# Patient Record
Sex: Male | Born: 1995 | Race: White | Hispanic: No | Marital: Married | Smoking: Never smoker
Health system: Southern US, Community
[De-identification: ages and names within clinical notes are randomized; demographics above are authoritative.]

## PROBLEM LIST (undated history)

## (undated) DIAGNOSIS — K219 Gastro-esophageal reflux disease without esophagitis: Secondary | ICD-10-CM

## (undated) DIAGNOSIS — F909 Attention-deficit hyperactivity disorder, unspecified type: Secondary | ICD-10-CM

## (undated) DIAGNOSIS — F419 Anxiety disorder, unspecified: Secondary | ICD-10-CM

## (undated) DIAGNOSIS — J301 Allergic rhinitis due to pollen: Secondary | ICD-10-CM

## (undated) HISTORY — DX: Gastro-esophageal reflux disease without esophagitis: K21.9

## (undated) HISTORY — DX: Attention-deficit hyperactivity disorder, unspecified type: F90.9

## (undated) HISTORY — DX: Anxiety disorder, unspecified: F41.9

## (undated) HISTORY — DX: Allergic rhinitis due to pollen: J30.1

---
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
## 2018-03-17 ENCOUNTER — Telehealth: Payer: Self-pay | Admitting: Physician Assistant

## 2018-03-17 NOTE — Telephone Encounter (Signed)
Patient has appointment with me on Friday, Nov 8.  Please inform patient that I do not prescribe ADD and ADHD medications. I am happy to see patient as long as he understands that I do not prescribe these medications.  Thanks!  Lelon Mast

## 2018-03-18 NOTE — Telephone Encounter (Signed)
Left message to return call to our office.  

## 2018-03-19 ENCOUNTER — Encounter: Payer: Self-pay | Admitting: Physician Assistant

## 2018-03-19 ENCOUNTER — Ambulatory Visit (INDEPENDENT_AMBULATORY_CARE_PROVIDER_SITE_OTHER): Payer: 59 | Admitting: Physician Assistant

## 2018-03-19 VITALS — BP 130/84 | HR 92 | Temp 98.3°F | Ht 66.5 in | Wt 208.5 lb

## 2018-03-19 DIAGNOSIS — F321 Major depressive disorder, single episode, moderate: Secondary | ICD-10-CM | POA: Diagnosis not present

## 2018-03-19 DIAGNOSIS — Z23 Encounter for immunization: Secondary | ICD-10-CM

## 2018-03-19 DIAGNOSIS — F419 Anxiety disorder, unspecified: Secondary | ICD-10-CM | POA: Diagnosis not present

## 2018-03-19 DIAGNOSIS — F909 Attention-deficit hyperactivity disorder, unspecified type: Secondary | ICD-10-CM | POA: Diagnosis not present

## 2018-03-19 NOTE — Patient Instructions (Addendum)
We will are going to try to get you in to see Dr. Rene Kocher, a psychiatrist, as soon as possible to help you get back on track.  If you have worsening depression or thoughts, please go to the ER.    [facility] and Scheduling: [phone]

## 2018-03-19 NOTE — Progress Notes (Signed)
Dylan Vasquez is a 22 y.o. male here to Establish Care.  I acted as a Neurosurgeon for [employer], PA-C Corky Mull, LPN  History of Present Illness:   Chief Complaint  Patient presents with  . Establish Care  . Anxiety  . referrall for ADHD    Acute Concerns: Anxiety and Depression, ADHD - hx of severe. States that it seems to be returning. He is currently in his first semester of law school. Was seeing a psychiatrist at [facility] while there. States that he was dx with ADHD at age 89. Was retested in late teens and confirmed ongoing ADHD. Also endorses dysgraphia and dyslexia. Hx of suicide attempt in 2015 -- was sitting in a car in his garage while the car was running. Saw a therapist at one point but didn't find it to be helpful. While at [facility] he was seeing a psychiatrist. He states that he is uncertain of all of the medications that he has been on in the past but was on both prozac and vyvanse at one point. States that "I was on a 'really high dose' of one of them." In 2017 he stopped his vyvanse as he was doing better in school and thought he didn't need it. He is now in his first semester at [employer] and his grades are suffering, he thinks that he needs to resume his stimulant. He endorses difficulty focusing. Denies any current SI/HI. Has relatively good support group. Hangs out with other law students and has several hobbies.   Denies excessive ETOH intake, approx 3-6 beers/week.  Health Maintenance: Immunizations -- UTD, will give Flu shot today Diet and exercise -- fair, has limited time to exercise Caffeine intake -- none, doesn't help with staying awake and causes GI distress Sleep habits -- 7-8 hours of sleep is good Weight -- Weight: 208 lb 8 oz (94.6 kg)   Depression screen PHQ 2/9 03/19/2018  Decreased Interest 1  Down, Depressed, Hopeless 1  PHQ - 2 Score 2  Altered sleeping 2  Tired, decreased energy 3  Change in appetite 3  Feeling bad or failure about yourself  3   Trouble concentrating 2  Moving slowly or fidgety/restless 3  Suicidal thoughts 1  PHQ-9 Score 19  Difficult doing work/chores Somewhat difficult    GAD 7 : Generalized Anxiety Score 03/19/2018  Nervous, Anxious, on Edge 2  Control/stop worrying 2  Worry too much - different things 2  Trouble relaxing 2  Restless 1  Easily annoyed or irritable 2  Afraid - awful might happen 3  Total GAD 7 Score 14  Anxiety Difficulty Very difficult     Past Medical History:  Diagnosis Date  . ADHD (attention deficit hyperactivity disorder)   . Anxiety   . GERD (gastroesophageal reflux disease)      Social History   Socioeconomic History  . Marital status: Single    Spouse name: Not on file  . Number of children: Not on file  . Years of education: Not on file  . Highest education level: Not on file  Occupational History  . Not on file  Social Needs  . Financial resource strain: Not on file  . Food insecurity:    Worry: Not on file    Inability: Not on file  . Transportation needs:    Medical: Not on file    Non-medical: Not on file  Tobacco Use  . Smoking status: Never Smoker  . Smokeless tobacco: Never Used  Substance and  Sexual Activity  . Alcohol use: Yes    Comment: 3-6 beers a week or liquor  . Drug use: Never  . Sexual activity: Yes  Lifestyle  . Physical activity:    Days per week: Not on file    Minutes per session: Not on file  . Stress: Not on file  Relationships  . Social connections:    Talks on phone: Not on file    Gets together: Not on file    Attends religious service: Not on file    Active member of club or organization: Not on file    Attends meetings of clubs or organizations: Not on file    Relationship status: Not on file  . Intimate partner violence:    Fear of current or ex partner: Not on file    Emotionally abused: Not on file    Physically abused: Not on file    Forced sexual activity: Not on file  Other Topics Concern  . Not on file   Social History Narrative  . Not on file    Past Surgical History:  Procedure Laterality Date  . WISDOM TOOTH EXTRACTION Bilateral 2013    History reviewed. No pertinent family history.  No Known Allergies   Current Medications:   Current Outpatient Medications:  .  cetirizine (ZYRTEC) 10 MG tablet, Take 10 mg by mouth as needed for allergies., Disp: , Rfl:  .  ibuprofen (ADVIL) 200 MG tablet, Take 200-400 mg by mouth as needed., Disp: , Rfl:    Review of Systems:   ROS  Negative unless otherwise specified per HPI.  Vitals:   Vitals:   03/19/18 1113  BP: 130/84  Pulse: 92  Temp: 98.3 F (36.8 C)  TempSrc: Oral  SpO2: 98%  Weight: 208 lb 8 oz (94.6 kg)  Height: 5' 6.5" (1.689 m)     Body mass index is 33.15 kg/m.  Physical Exam:   Physical Exam  Constitutional: He appears well-developed. He is cooperative.  Non-toxic appearance. He does not have a sickly appearance. He does not appear ill. No distress.  Cardiovascular: Normal rate, regular rhythm, S1 normal, S2 normal, normal heart sounds and normal pulses.  No LE edema  Pulmonary/Chest: Effort normal and breath sounds normal.  Neurological: He is alert. GCS eye subscore is 4. GCS verbal subscore is 5. GCS motor subscore is 6.  Skin: Skin is warm, dry and intact.  Psychiatric: His speech is normal and behavior is normal. His mood appears anxious.  Nursing note and vitals reviewed.   Assessment and Plan:    Timohty was seen today for establish care, anxiety and referrall for adhd.  Diagnoses and all orders for this visit:  Anxiety; Depression, major, single episode, moderate (HCC); Attention deficit hyperactivity disorder (ADHD), unspecified ADHD type Uncontrolled. He is agreeable to seeing psychiatry. Will refer to Dr. Angus Palms. I discussed with patient that if they develop any SI, to tell someone immediately and seek medical attention. Will encourage talk therapy as able. We considered checking  baseline labs today however patient declined 2/2 needle phobia, although he was amenable to flu vaccine. -     Ambulatory referral to Psychiatry  Need for prophylactic vaccination and inoculation against influenza -     Flu Vaccine QUAD 36+ mos IM  . Reviewed expectations re: course of current medical issues. . Discussed self-management of symptoms. . Outlined signs and symptoms indicating need for more acute intervention. . Patient verbalized understanding and all questions were answered. Marland Kitchen  See orders for this visit as documented in the electronic medical record. . Patient received an After-Visit Summary.  CMA or LPN served as scribe during this visit. History, Physical, and Plan performed by medical provider. The above documentation has been reviewed and is accurate and complete.  Jarold Motto, PA-C

## 2018-03-22 NOTE — Telephone Encounter (Signed)
Pt seen and referred.

## 2018-04-07 ENCOUNTER — Ambulatory Visit (INDEPENDENT_AMBULATORY_CARE_PROVIDER_SITE_OTHER): Payer: 59 | Admitting: Mental Health

## 2018-04-07 DIAGNOSIS — F3341 Major depressive disorder, recurrent, in partial remission: Secondary | ICD-10-CM | POA: Diagnosis not present

## 2018-04-07 DIAGNOSIS — F410 Panic disorder [episodic paroxysmal anxiety] without agoraphobia: Secondary | ICD-10-CM | POA: Diagnosis not present

## 2018-04-07 DIAGNOSIS — F411 Generalized anxiety disorder: Secondary | ICD-10-CM | POA: Diagnosis not present

## 2018-04-07 NOTE — Progress Notes (Signed)
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 D/x by Dr. Epimenio FootKathy Ferrell-Swann at age [age]. 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  He stated he copes w/ a baseline anxiety, reports 4/10 in severity. Anxiety increases when confronted aggressively (someone yelling at him), when around multiple couples he knows and he is the "odd man out". Pt can be outgoing w/ friends, tries to use humor.   He stated he attempted suicide at age 22 due "my therapist saying some uncomfortable words. She told me I couldn't make the changes I wanted b/c I needed to accept myself first. He attempted suicide by running a car in the garage when the door shut to die due to exhaust chemicals from the car. His mother found and stopped him. He did not go to the hospital, mother and father talked w/ him a lot at that time. Since then, he stated I "stamp it down" referring to feelings of self harm returns, SI. He denied self injurious behaviors. He reported he copes w/ those passive SI moments about 1-3x/year.    Mental Status Exam:   Appearance:   Casual     Behavior:  Appropriate  Motor:  Normal  Speech/Language:   Clear and Coherent  Affect:  Appropriate  Mood:  normal  Thought process:  normal  Thought content:    WNL  Sensory/Perceptual disturbances:    WNL  Orientation:  oriented to person, place, time/date and situation  Attention:  Good  Concentration:  Good  Memory:  WNL  Fund of knowledge:   Good  Insight:    Good  Judgment:   Good  Impulse Control:  Good   Reported Symptoms:  Anxiety most  days, fatigue (possibly due to school demands), panic attacks (once every 2 months)  Risk Assessment: Danger to Self:  No Self-injurious Behavior: No Danger to Others: No Duty to Warn:no Physical Aggression / Violence:No  Access to Firearms a concern: No  Gang Involvement:No  Patient / guardian was educated about steps to take if suicide or homicide risk level increases between visits: yes While future psychiatric events cannot be accurately predicted, the patient does not currently require acute inpatient psychiatric care and does not currently meet [facility] involuntary commitment criteria.  Substance Abuse History: Current substance abuse: No     Past Psychiatric History:   Previous psychological history is significant for ADHD, anxiety and learning disability Outpatient Providers[place] up [place] 22 about 6 months. History of Psych Hospitalization: none Psychological Testing: age 22 - through the school   Medical History/Surgical History: Past Medical History:  Diagnosis Date  . ADHD (attention deficit hyperactivity disorder)   . Anxiety   . GERD (gastroesophageal reflux disease)     Past Surgical History:  Procedure Laterality Date  . WISDOM TOOTH EXTRACTION Bilateral 2013    Medications: Current Outpatient Medications  Medication Sig Dispense Refill  . cetirizine (ZYRTEC) 10 MG tablet Take 10 mg by mouth as needed for allergies.    Marland Kitchen. ibuprofen (ADVIL) 200 MG tablet Take 200-400 mg by mouth as needed.  No current facility-administered medications for this visit.     No Known Allergies  Abuse History: Victim: none Report needed: no Perpetrator of abuse: no Witness / Exposure to Domestic Violence:  none Protective Services Involvement: no Witness to [employer] Violence:  no   Family / Social History:    Living situation: Lives alone Sexual Orientation: heterosexual Relationship Status:   single Name of spouse /  other: none If a parent, number of children / ages:   none  Support Systems:  Family, friends  Surveyor, quantity Stress:   none  Income/Employment/Disability: full time Publishing rights manager: no  Educational History:   B.A in Anthropology, currently law school at [employer]:      Any cultural differences that may affect / interfere with treatment:    Recreation/Hobbies: board games, time w/ friends  Stressors:  School, social, chronicity of sx's  Strengths:  Engaging, compassionate,   Barriers: none  Legal History: none  Pending legal issue / charges: none  History of legal issue / charges: none   Diagnoses:    1.  GAD (generalized anxiety disorder) F41.1      2.  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  Major depressive disorder, recurrent episode, in partial remission (HCC)        1.  Patient to continue to engage in individual counseling 2-4 times a month or as needed. 2.  Patient to identify and apply CBT, coping skills learned in session to decrease depression and anxiety symptoms. 3.  Patient to contact this office, go to the local ED or call 911 if a crisis or emergency develops between visits.   Waldron Session, [facility]

## 2018-05-17 ENCOUNTER — Ambulatory Visit: Payer: 59 | Admitting: Mental Health

## 2018-06-07 ENCOUNTER — Ambulatory Visit (INDEPENDENT_AMBULATORY_CARE_PROVIDER_SITE_OTHER): Payer: 59 | Admitting: Mental Health

## 2018-06-07 DIAGNOSIS — F411 Generalized anxiety disorder: Secondary | ICD-10-CM | POA: Diagnosis not present

## 2018-06-07 NOTE — Progress Notes (Signed)
Psychotherapy Note  Name: Dylan Vasquez Date: 06/07/2018 MRN: [id number] DOB: 05-Jul-1995 PCP: Jarold Motto, PA  Time spent: 58 mintues  Treatment:   Individual Therapy  Mental Status Exam:   Appearance:   Casual     Behavior:  Appropriate  Motor:  Normal  Speech/Language:   Clear and Coherent  Affect:  Appropriate  Mood:  normal  Thought process:  normal  Thought content:    WNL  Sensory/Perceptual disturbances:    WNL  Orientation:  oriented to person, place, time/date and situation  Attention:  Good  Concentration:  Good  Memory:  WNL  Fund of knowledge:   Good  Insight:    Good  Judgment:   Good  Impulse Control:  Good   Reported Symptoms:  Anxiety most days, fatigue (possibly due to school demands), panic attacks (once every 2 months)  Risk Assessment: Danger to Self:  No Self-injurious Behavior: No Danger to Others: No Duty to Warn:no Physical Aggression / Violence:No  Access to Firearms a concern: No  Gang Involvement:No  Patient / guardian was educated about steps to take if suicide or homicide risk level increases between visits: yes While future psychiatric events cannot be accurately predicted, the patient does not currently require acute inpatient psychiatric care and does not currently meet [facility] involuntary commitment criteria.  Subjective:  Stated he has had some stress w/ school, specifically with one teacher, but he has been resourceful. Some rumination at night, worries about class if he does not stay busy. He tries to go for walks a night but tries to be [place].  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 He got anxious, he showed up early for the pictures, has a tendency to worry- does not want to make others unhappy. He did not want to get "sniped at", "my grM has a tendency to do this when you mess up". Pt is closer to his  mother, father worked a lot but they verbalized loving each other are supportive. He plans to integrate diaphragmatic breathing exercises discussed today as a coping tool between sessions.     Interventions:  CBT, supportive therapy     1.  Patient to continue to engage in individual counseling 2-4 times a month or as needed. 2.  Patient to identify and apply CBT, coping skills learned in session to decrease depression and anxiety symptoms. 3.  Patient to contact this office, go to the local ED or call 911 if a crisis or emergency develops between visits.   Diagnoses:    1.  GAD (generalized anxiety disorder) F41.1      2.  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  Major depressive disorder, recurrent episode, in partial remission (HCC)       Plan: 1.  Patient to continue to engage in individual counseling 2-4 times a month or as needed. 2.  Patient to identify and apply CBT, coping skills learned in session to decrease depression and anxiety symptoms. 3.  Patient to contact this office, go to the local ED or call 911 if a crisis or emergency develops between visits.   Waldron Session, [facility]

## 2018-07-01 ENCOUNTER — Ambulatory Visit: Payer: 59 | Admitting: Mental Health

## 2018-07-21 ENCOUNTER — Other Ambulatory Visit: Payer: Self-pay

## 2018-07-21 ENCOUNTER — Ambulatory Visit (INDEPENDENT_AMBULATORY_CARE_PROVIDER_SITE_OTHER): Payer: 59 | Admitting: Mental Health

## 2018-07-21 DIAGNOSIS — F411 Generalized anxiety disorder: Secondary | ICD-10-CM

## 2018-07-21 NOTE — Progress Notes (Signed)
Psychotherapy Note  Name: Dylan Vasquez Date: 07/21/2018 MRN: [id number] DOB: 1996/03/21 PCP: Jarold Motto, PA  Time spent: 91 mintues  Treatment:   Individual Therapy  Mental Status Exam:   Appearance:   Casual     Behavior:  Appropriate  Motor:  Normal  Speech/Language:   Clear and Coherent  Affect:  Appropriate  Mood:  Congruent, anxious  Thought process:  normal  Thought content:    WNL  Sensory/Perceptual disturbances:    WNL  Orientation:  oriented to person, place, time/date and situation  Attention:  Good  Concentration:  Good  Memory:  WNL  Fund of knowledge:   Good  Insight:    Good  Judgment:   Good  Impulse Control:  Good   Reported Symptoms:  Anxiety most days, fatigue (possibly due to school demands), panic attacks (once every 2 months)  Risk Assessment: Danger to Self:  No Self-injurious Behavior: No Danger to Others: No Duty to Warn:no Physical Aggression / Violence:No  Access to Firearms a concern: No  Gang Involvement:No  Patient / guardian was educated about steps to take if suicide or homicide risk level increases between visits: yes While future psychiatric events cannot be accurately predicted, the patient does not currently require acute inpatient psychiatric care and does not currently meet [facility] involuntary commitment criteria.  Subjective:  He shared progress at school. He has finals next week, some anxiety related but is managing well. He is prepared.  He shared social experiences, how he is making some friends at school and this is a positive change in his life.  He continues to reach out to his teacher as needed for clarification and assistance, working on being assertive in this area.  Due to the recent virus pandemic, patient stated that his school is suspending face-to-face instruction and they are to pursue on line instruction for the remainder of the semester.  Patient stated he plans to return home to his parents during this  time.  Ways to cope and care for himself were explored.  Patient was encouraged to contact this office between sessions if needed.  Interventions:  CBT, supportive therapy   Diagnoses:    1.  GAD (generalized anxiety disorder) F41.1      2.  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  Major depressive disorder, recurrent episode, in partial remission (HCC)       Plan: 1.  Patient to continue to engage in individual counseling 2-4 times a month or as needed. 2.  Patient to identify and apply CBT, coping skills learned in session to decrease depression and anxiety symptoms. 3.  Patient to contact this office, go to the local ED or call 911 if a crisis or emergency develops between visits.   Waldron Session, [facility]

## 2018-08-18 ENCOUNTER — Ambulatory Visit (INDEPENDENT_AMBULATORY_CARE_PROVIDER_SITE_OTHER): Payer: 59 | Admitting: Mental Health

## 2018-08-18 ENCOUNTER — Other Ambulatory Visit: Payer: Self-pay

## 2018-08-18 DIAGNOSIS — F411 Generalized anxiety disorder: Secondary | ICD-10-CM | POA: Diagnosis not present

## 2018-08-18 NOTE — Progress Notes (Signed)
Psychotherapy Note  Name: Dylan ShutterWilliam Schoening Date: 08/18/2018 MRN: [id number] DOB: June 26, 1995 PCP: Jarold MottoWorley, Samantha, PA  Time spent: 45 mintues  Treatment:   Individual Therapy  Mental Status Exam:   Appearance:   UTA- unable to assess    Behavior:  UTA  Motor:  UTA  Speech/Language:   Clear and Coherent  Affect:  UTA  Mood:  Congruent, anxious  Thought process:  normal  Thought content:    WNL  Sensory/Perceptual disturbances:    WNL  Orientation:  oriented to person, place, time/date and situation  Attention:  Good  Concentration:  Good  Memory:  WNL  Fund of knowledge:   Good  Insight:    Good  Judgment:   Good  Impulse Control:  Good   Reported Symptoms:  Anxiety most days, fatigue (possibly due to school demands), panic attacks (once every 2 months)  Risk Assessment: Danger to Self:  No Self-injurious Behavior: No Danger to Others: No Duty to Warn:no Physical Aggression / Violence:No  Access to Firearms a concern: No  Gang Involvement:No  Patient / guardian was educated about steps to take if suicide or homicide risk level increases between visits: yes While future psychiatric events cannot be accurately predicted, the patient does not currently require acute inpatient psychiatric care and does not currently meet [facility]  involuntary commitment criteria.  Subjective:   Pt engaged in teletherapy session. He shared progress at school, continues online classes.  Finished last finals well academically. Trying to keep up w/ school, listening to audio books, talking w/ friends. Trying to avoid too much downtime. He shared social experiences, how he is making some friends at school and this is a positive change in his life.  He continues to reach out to his teacher as needed for clarification and assistance, working on being assertive in this area.  Due to the recent virus pandemic, he is sharing how he is coping. Some worry about his grM due to her age and her not being  "cautious enough".  Relationships w/ parents, sister with whom he is living was assess, stated they are doing well, all staying busy with school work and parents w/ work.  Encouraged keeping a daily schedule, he is making efforts in this area. Plans to continue mindfulness to evaluating self talk to manage anxiety and depression sx's.  Patient was encouraged to contact this office between sessions if needed.   Virtual Visit via Telephone Note I connected with patient by a video enabled telemedicine application or telephone, with their informed consent, and verified patient privacy and that I am speaking with the correct person using two identifiers.    I discussed the limitations, risks, security and privacy concerns of performing psychotherapy and management service by telephone and the availability of in person appointments. I also discussed with the patient that there may be a patient responsible charge related to this service. The patient expressed understanding and agreed to proceed.  I discussed the treatment planning with the patient. The patient was provided an opportunity to ask questions and all were answered. The patient agreed with the plan and demonstrated an understanding of the instructions.   The patient was advised to call  our office if  symptoms worsen or feel they are in a crisis state and need immediate contact.  Interventions:  CBT, supportive therapy  Diagnoses:    1.  GAD (generalized anxiety disorder) F41.1      2.  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  Major depressive disorder, recurrent episode, in partial remission (HCC)      Plan: 1.  Patient to continue to engage in individual counseling 2-4 times a month or as needed. 2.  Patient to identify and apply CBT, coping skills learned in session to decrease depression and anxiety symptoms. 3.  Patient to contact this office, go to the local ED or call 911 if a crisis or emergency develops between  visits.   Waldron Session, [facility]

## 2019-01-06 ENCOUNTER — Other Ambulatory Visit: Payer: Self-pay

## 2019-01-06 ENCOUNTER — Encounter: Payer: Self-pay | Admitting: Psychiatry

## 2019-01-06 ENCOUNTER — Ambulatory Visit (INDEPENDENT_AMBULATORY_CARE_PROVIDER_SITE_OTHER): Payer: BC Managed Care – PPO | Admitting: Psychiatry

## 2019-01-06 VITALS — BP 137/76 | HR 68 | Ht 67.0 in | Wt 200.0 lb

## 2019-01-06 DIAGNOSIS — F411 Generalized anxiety disorder: Secondary | ICD-10-CM | POA: Diagnosis not present

## 2019-01-06 DIAGNOSIS — F810 Specific reading disorder: Secondary | ICD-10-CM | POA: Diagnosis not present

## 2019-01-06 DIAGNOSIS — F82 Specific developmental disorder of motor function: Secondary | ICD-10-CM | POA: Insufficient documentation

## 2019-01-06 DIAGNOSIS — F900 Attention-deficit hyperactivity disorder, predominantly inattentive type: Secondary | ICD-10-CM

## 2019-01-06 DIAGNOSIS — F8181 Disorder of written expression: Secondary | ICD-10-CM | POA: Diagnosis not present

## 2019-01-06 MED ORDER — LISDEXAMFETAMINE DIMESYLATE 30 MG PO CAPS: 30.0000 mg | ORAL_CAPSULE | Freq: Every day | ORAL | 0 refills | Status: DC

## 2019-01-06 NOTE — Progress Notes (Signed)
[facility] MD/PA/NP Initial Note  01/06/2019 12:57 PM [name]  MRN:  [id number] PCP: Inda Coke, PA Time spent: 45 minutes from 0800 to 0845  Chief Complaint:  Chief Complaint    Anxiety; ADHD; Depression; Panic Attack      HPI: Dylan Vasquez is seen onsite in office face-to-face individually with therapy and epic collateral for psychiatric interview and exam in evaluation and management of therapy differential of generalized and panic anxiety with major depression at least by history while patient's chief concern is ADHD.  Patient reports having awkward anxiety through the past not fully social or panic estimating 1 or 2 panic attacks a year.  However he has generalized worry with doubts consequential daily. He did have a suicide attempt apparently in high school running the car in the garage for 3 minutes before intervened by family, though he did not receive hospitalization then and was more desperate for help than wanting to die.  He also fell back in a chair in oedipal years requiring stapling of a scalp laceration having questionable loss of consciousness briefly.  Testing apparently in middle school concluded ADHD-like father who may also have learning disabilities, while patient was tested at least in high school by Reinaldo Berber, PhD in [place] finding learning variances for which he currently also receives accommodations at [employer] starting his second year then to have 1-1/2 years to graduate.  He may have had therapy first of all or foremost in [place] that was not a successful experience.  He went off of Vyvanse for a while in college realizing the need subsequently and restarting particularly in law school.  Vyvanse 70 mg had caused weight loss of 10 pounds while 30 mg has been his best dose for the balance of anxiety and ADHD.  He has most recently been under the psychiatric care of Patriciaann Clan, [place] at [facility] with last fill of Vyvanse per [place] registry  being 10/25/2018.  He had Prozac in high school still having some capsules remaining in his possessions but declining to take further OR similar agent at this time finding Prozac was hard on his GERD and stomach system in general.  He has difficulty recalling the name for certain but Inda Coke, PA has documented that he had Prozac at that time as his only antianxiety or antidepressant.  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  Family now is moving from [place] where they moved to join him in college at [employer] degree in anthropology achieved in 4 years with father now getting a better job in [place] with insurance better and patient comfortable to continue his Vyvanse and his accommodations for his learning variances at [employer].  He is not having depression, panic attacks, suicidality, mania, psychosis, or substance use.  Visit Diagnosis:    ICD-10-CM   1. Attention deficit hyperactivity disorder (ADHD), inattentive type, moderate  F90.0 lisdexamfetamine (VYVANSE) 30 MG capsule  2. Generalized anxiety disorder  F41.1   3. 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 Developmental coordination disorder  F82     Past Psychiatric History: ADHD treatment primarily Vyvanse 70 mg causing weight loss of 10 pounds rapidly but 30 mg being best tolerated and post efficacious taken since seventh grade but stopped in college until realizing her law school needed to have regular dosing.  Testing by Epimenio FootKathy Ferrell-Swann, PhD in  [place] in early high school apparently after suicide attempt with carbon monoxide identified learning disorders also, now still having accommodations and [employer] law school.  Therapy was unsuccessful in late high schoo and Prozac for anxiety in high school 20 mg was not tolerated well because of GERD and other GI side effects.  He now takes only Vyvanse had 4 sessions of therapy and winter and spring 2020 with [facility] now transferring his Vyvanse 30 mg daily for law school [facility] [place] for combined management in this office.  Past Medical History:  Past Medical History:  Diagnosis Date  . ADHD (attention deficit hyperactivity disorder)   . Allergic rhinitis due to pollen   . Anxiety   . GERD (gastroesophageal reflux disease)     Past Surgical History:  Procedure Laterality Date  . WISDOM TOOTH EXTRACTION Bilateral 2013    Family Psychiatric History: Patient only acknowledges father having ADHD and possibly some learning deficits.  Sister Irving Burtonmily and mother are not knowledge to have symptoms.  Family History:  Family History  Problem Relation Age of Onset  . Heart disease Father   . High Cholesterol Father   . Hypertension Father   . Learning disabilities Father   . ADD / ADHD Father   . Diabetes Maternal Grandfather   . Early death Maternal Grandfather   . Heart attack Maternal Grandfather   . Heart disease Maternal Grandfather   . High Cholesterol Maternal Grandfather   . Alcohol abuse Paternal Grandfather   . Heart attack Paternal Grandfather   . Heart disease Paternal Grandfather   . Prostate cancer Neg Hx   . Colon cancer Neg Hx     Social History:  Social History   Socioeconomic History  . Marital status: Single    Spouse name: Not on file  . Number of children: Not on file  . Years of education: Not on file  . Highest education level: Bachelor's degree (e.g., BA, AB, BS)  Occupational History  . 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  Financial resource strain: Not hard at all  . Food insecurity    Worry: Never true    Inability: Never true  . Transportation needs    Medical: No    Non-medical: No  Tobacco Use  . Smoking status: Never Smoker  . Smokeless tobacco: Never Used  Substance and Sexual Activity  . Alcohol use: Yes    Comment: 3-6 beers a week or liquor  . Drug use: Never  . Sexual activity: Yes  Lifestyle  . Physical activity    Days per week: Not on file    Minutes per session: Not on file  . Stress: To some extent  Relationships  . Social Musicianconnections    Talks on phone: Not on file    Gets together: Not on file    Attends religious service: Not on file    Active member of club or organization: Not on file    Attends meetings of clubs or organizations: Not on file    Relationship status: Not on file  Other Topics Concern  . 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  He remains integrated with parents as father is securing a better job moving to [employer] leaving the college town [place] where patient attended [employer] for anthropology as undergraduate.. Dylan Vasquez is diligent in securing his best support, opportunity, and compensation for vulnerability.  Though he has a broad differential diagnosis, he consolidates understanding and applications in the session to that most imperative for his current performance and quality of life and that which can be addressed if needed in the future should symptoms and consequences become more desperate.     Allergies: No Known Allergies  Metabolic Disorder Labs: No results found for: HGBA1C, MPG No results found for: PROLACTIN No results found for: CHOL, TRIG, HDL, CHOLHDL, VLDL, LDLCALC No results found for:  TSH  Therapeutic Level Labs: No results found for: LITHIUM No results found for: VALPROATE No components found for:  CBMZ  Current Medications: Current Outpatient Medications  Medication Sig Dispense Refill  . cetirizine (ZYRTEC) 10 MG tablet Take 10 mg by mouth as needed for allergies.    Marland Kitchen ibuprofen (ADVIL) 200 MG tablet Take 200-400 mg by mouth as needed.    Marland Kitchen lisdexamfetamine (VYVANSE) 30 MG capsule Take 1 capsule (30 mg total) by mouth daily after breakfast. 30 capsule 0   No current facility-administered medications for this visit.     Medication Side Effects: none  Orders placed this visit:  No orders of the defined types were placed in this encounter.   Psychiatric Specialty Exam:  Review of Systems  Constitutional: Positive for malaise/fatigue.       Obesity with BMI 31.3  HENT: Positive for congestion.        Allergic rhinitis treated with Zyrtec having an appointment in 2 weeks with an allergist likely relative to airborne pollens.  Eyes: Negative.   Respiratory: Negative.   Cardiovascular: Negative.   Gastrointestinal: Positive for abdominal pain, heartburn and nausea.       Longstanding GERD worse from Prozac which also caused other GI side effects  Genitourinary: Negative.   Musculoskeletal: Negative.   Skin: Negative.   Neurological: Negative.  Negative for tremors, speech change, seizures, loss of consciousness and headaches.  Endo/Heme/Allergies: Positive for environmental allergies and polydipsia.       Zyrtec treatment currently to see allergist soon  Psychiatric/Behavioral: Negative for depression, hallucinations, memory loss, substance abuse and suicidal ideas. The patient is nervous/anxious and has insomnia.        ADHD and learning disorders  Left handed with full range of motion cervical spine.Muscle strengths and tone 5/5, postural reflexes and gait 0/0, and AIMS = 0.  Neuro Armandina Stammer functions are intact and AMR equals 0/0.  He has no neurocutaneous  stigmata and no soft neurologic findings.  Thyroid is normal and PERRLA 4 mm with EOMs intact.  Blood pressure 137/76, pulse 68, height 5\' 7"  (1.702 m), weight 200 lb (90.7 kg).Body mass index is 31.32 kg/m.  General Appearance: Casual, Fairly Groomed, Guarded and Obese  Eye Contact:  Fair  Speech: Clear and coherent, normal rate, and talkative  Volume:  Normal  Mood:  Anxious, Dysphoric, Euthymic and Worthless  Affect:  Congruent, Inappropriate, Labile, Full Range and Anxious  Thought Process:  Coherent, Goal Directed, Irrelevant and Linear  Orientation:  Full (Time, Place, and Person)  Thought Content: Ilusions, Obsessions, Paranoid Ideation and Rumination   Suicidal Thoughts:  No  Homicidal Thoughts:  No  Memory:  Immediate;   Good Remote;   Fair  Judgement:  Fair  Insight:  Fair  Psychomotor Activity:  Normal, Mannerisms and Restlessness  Concentration:  Concentration: Fair and Attention Span: Fair  Recall:  Good  Fund of Knowledge: Good  Language: Fair  Assets:  Desire for Improvement Resilience Talents/Skills  ADL's:  Intact  Cognition: WNL  Prognosis:  Good   Screenings: Adult mood disorder questionnaire endorses 4 out of 13 items proximate in time but of no problem severity most consistent with ADHD relative to rapid disorganized and inconsistent thinking and distracted concentration as though more energetic mentally.  No bipolar diathesis is currently evident.  GAD-7     [facility]  Total GAD-7 Score  14    PHQ2-9     [facility]  PHQ-2 Total Score  2  PHQ-9 Total Score  19      Receiving Psychotherapy: Yes [facility], [facility]  Treatment Plan/Recommendations: Over 50% of the time is spent in counseling and coordination of care as hierarchy of symptoms for diagnostic treatment needed and therapeutic options integrate patient's decision making and  understanding into self-directed life changes.  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  He declines to include parents in any way today.  He refuses Pristiq, Trintellix, Zoloft, or Pamelor as options for anxiety that Dylan Vasquez also facilitate improved focus and organization are clarified for him.  He is E scribed Vyvanse 30 mg every morning #30 with [facility] [address] [facility], patient requiring return for follow-up in 4 weeks prior to any additional supply.  Warnings and risk of diagnoses and treatment including medication are reviewed for prevention and monitoring, safety hygiene, and crisis plans if needed.  He gives approval to explain to parents should they request such about his assessment or treatment in the interim.    Chauncey MannGlenn E Starkeisha Vanwinkle, MD

## 2019-02-02 ENCOUNTER — Ambulatory Visit (INDEPENDENT_AMBULATORY_CARE_PROVIDER_SITE_OTHER): Payer: BC Managed Care – PPO | Admitting: Psychiatry

## 2019-02-02 ENCOUNTER — Encounter: Payer: Self-pay | Admitting: Psychiatry

## 2019-02-02 ENCOUNTER — Other Ambulatory Visit: Payer: Self-pay

## 2019-02-02 VITALS — Ht 67.0 in | Wt 200.0 lb

## 2019-02-02 DIAGNOSIS — F900 Attention-deficit hyperactivity disorder, predominantly inattentive type: Secondary | ICD-10-CM

## 2019-02-02 DIAGNOSIS — F8181 Disorder of written expression: Secondary | ICD-10-CM

## 2019-02-02 DIAGNOSIS — F810 Specific reading disorder: Secondary | ICD-10-CM | POA: Diagnosis not present

## 2019-02-02 DIAGNOSIS — F82 Specific developmental disorder of motor function: Secondary | ICD-10-CM

## 2019-02-02 DIAGNOSIS — F411 Generalized anxiety disorder: Secondary | ICD-10-CM

## 2019-02-02 MED ORDER — METHYLPHENIDATE HCL ER (OSM) 36 MG PO TBCR: 36.0000 mg | EXTENDED_RELEASE_TABLET | Freq: Every day | ORAL | 0 refills | Status: DC

## 2019-02-02 NOTE — Progress Notes (Signed)
[facility] Med Check  Patient ID: Dylan Vasquez,  MRN: [id number]  PCP: Inda Coke, PA  Date of Evaluation: 02/02/2019 Time spent:20 minutes from 0900 to 0920  Chief Complaint:  Chief Complaint    ADHD; Anxiety      HISTORY/CURRENT STATUS: Will is seen onsite in office 20 minutes face to face individually with consent with epic collateral for psychiatric interview and exam in 4-week evaluation and management of ADHD with comorbid generalized anxiety and learning disorders treated last session with Vyvanse 30 mg every morning dose reduced from his previous treatment with 70 mg after he did not tolerate Prozac for his anxiety in the past.  The patient is rigidly controlling to limit his treatment as he expects more problems than benefits but reports the need for help.  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  He acknowledges that impulse control is improved with the Vyvanse, but he did have an episode of becoming more intoxicated with a few drinks than he expected therefore sensitive to how others perceive him in the process.  He therefore seeks an alternative treatment from the same to him treatment matching but an alternative medication.  Continues to decline consideration of specific treatment for anxiety or cluster C traits obsession allergy.  In this way, his complaint of excessive sexual desire on Vyvanse.  He was tired and feeling foggy from 1 PM to 3 PM despite morning dosing of the medication with easy agitation and anxiety overall concluding he must change medications.  He has no mania, suicidality, psychosis, or delirium.  Anxiety Presents for initial visit. Onset was more than 5 years ago. The problem has been waxing and waning. Symptoms include chest pain, confusion, decreased concentration, dry mouth, excessive worry, feeling of  choking, insomnia, malaise, muscle tension, nausea, nervous/anxious behavior, obsessions and shortness of breath. Patient reports no compulsions, depressed mood, dizziness, hyperventilation, impotence, irritability, palpitations, panic, restlessness or suicidal ideas. Symptoms occur most days. The severity of symptoms is moderate. The symptoms are aggravated by family issues, medication, social activities and work stress. The quality of sleep is fair. Nighttime awakenings: occasional.   Risk factors include alcohol intake, a major life event, change in medication and family history. His past medical history is significant for anxiety/panic attacks and suicide attempts. There is no history of bipolar disorder or depression. Past treatments include SSRIs and counseling (CBT). The treatment provided mild relief. Compliance with prior treatments has been variable. Prior compliance problems include medication issues.    Individual Medical History/ Review of Systems: Changes? :Yes Medically stable in the interim though having nuisance type side effects exacerbating overthinking as did Vyvanse directly by stimulant action as well with GI effects and some heightened sex drive contributing to his foggy thinking.  Weight is exactly the same.  Allergies: Patient has no known allergies.  Current Medications:  Current Outpatient Medications:  .  cetirizine (ZYRTEC) 10 MG tablet, Take 10 mg by mouth as needed for allergies., Disp: , Rfl:  .  ibuprofen (ADVIL) 200 MG tablet, Take 200-400 mg by mouth as needed., Disp: , Rfl:  .  methylphenidate 36 MG PO CR tablet, Take 1 tablet (36 mg total) by mouth daily after breakfast., Disp: 30 tablet, Rfl: 0   Medication Side Effects: none  Family Medical/ Social History: Changes? No, father having ADHD and learning disabilities while paternal grandfather had alcohol use  disorder   MENTAL HEALTH EXAM:  Height 5\' 7"  (1.702 m), weight 200 lb (90.7 kg).Body mass index is  31.32 kg/m.  rest deferred in coronavirus shutdown except Muscle strengths and tone 5/5, postural reflexes and gait 0/0, and AIMS = 0.  General Appearance: Casual, Fairly Groomed, Guarded, Meticulous and Obese  Eye Contact:  Good  Speech:  Clear and Coherent, Normal Rate and Talkative  Volume:  Normal  Mood:  Anxious, Euthymic, Irritable and Worthless  Affect:  Congruent, Inappropriate, Labile, Restricted and Anxious  Thought Process:  Goal Directed, Irrelevant, Linear and Descriptions of Associations: Circumstantial  Orientation:  Full (Time, Place, and Person)  Thought Content: Obsessions and Rumination   Suicidal Thoughts:  No  Homicidal Thoughts:  No  Memory:  Immediate;   Fair Remote;   Fair to good  Judgement:  Fair  Insight:  Fair  Psychomotor Activity:  Normal and Mannerisms  Concentration:  Concentration: Fair and Attention Span: Poor  Recall:  of Knowledge: Good  Language: Good  Assets:  Desire for Improvement Leisure Time Resilience Vocational/Educational  ADL's:  Intact  Cognition: WNL  Prognosis:  Fair    DIAGNOSES:    ICD-10-CM   1. Attention deficit hyperactivity disorder (ADHD), inattentive type, moderate  F90.0 methylphenidate 36 MG PO CR tablet  2. Generalized anxiety disorder  F41.1   3. 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 Developmental coordination disorder  F82     Receiving Psychotherapy: Yes Four therapy sessions with [employer], LPC last in April without definite intent to return   RECOMMENDATIONS: Reduction in stimulant potency may best meet the symptom treatment matching for patient who has adverse effects with still over determined needs for his overthinking despite inattention and lack of sustained concentration.  Over 50% of the 20 minutes face-to-face for a total of 10 minutes is spent in counseling and  coordination of care with cognitive behavioral closure thought stopping habit reversal integrated modulation of ADHD, GAD, and cluster C traits treatment.  Vyvanse 30 mg is discontinued having the 1 fill 01/06/2019 per [place] registry. He is E scribed Concerta 36 mg every morning after breakfast sent as #30 [facility] [address] [facility] [place] [facility].  Therapeutics continue to address anxiety and integrated learning to return in 4 weeks for follow-up.   [place], MD

## 2019-03-02 ENCOUNTER — Other Ambulatory Visit: Payer: Self-pay

## 2019-03-02 ENCOUNTER — Encounter: Payer: Self-pay | Admitting: Psychiatry

## 2019-03-02 ENCOUNTER — Ambulatory Visit (INDEPENDENT_AMBULATORY_CARE_PROVIDER_SITE_OTHER): Payer: BC Managed Care – PPO | Admitting: Psychiatry

## 2019-03-02 VITALS — Ht 67.0 in | Wt 206.0 lb

## 2019-03-02 DIAGNOSIS — F411 Generalized anxiety disorder: Secondary | ICD-10-CM | POA: Diagnosis not present

## 2019-03-02 DIAGNOSIS — F82 Specific developmental disorder of motor function: Secondary | ICD-10-CM

## 2019-03-02 DIAGNOSIS — F8181 Disorder of written expression: Secondary | ICD-10-CM

## 2019-03-02 DIAGNOSIS — F900 Attention-deficit hyperactivity disorder, predominantly inattentive type: Secondary | ICD-10-CM

## 2019-03-02 DIAGNOSIS — F810 Specific reading disorder: Secondary | ICD-10-CM | POA: Diagnosis not present

## 2019-03-02 MED ORDER — METHYLPHENIDATE HCL ER (OSM) 36 MG PO TBCR: 36.0000 mg | EXTENDED_RELEASE_TABLET | Freq: Every day | ORAL | 0 refills | Status: DC

## 2019-03-02 NOTE — Progress Notes (Signed)
[facility] Check  Patient ID: Dylan Vasquez,  MRN: [id number]  PCP: Inda Coke, PA  Date of Evaluation: 03/02/2019 Time spent:10 minutes from 0945 to Kiana  Chief Complaint:  Chief Complaint    ADHD; Anxiety      HISTORY/CURRENT STATUS: Will is seen onsite in office 10 minutes face-to-face with consent with epic collateral for psychiatric interview and exam in 4-week evaluation and management of ADHD and generalized anxiety with learning variances.  Change from Vyvanse 30 mg to Concerta 36 mg has been successful with no zoning out now, but he does feel little drowsy in early afternoon.  He has had no concern with potentiation of social use of alcohol from Concerta.  He was somewhat depressed more than anxious a few days as the news and coronavirus were preoccupying his mind, but he is now looking forward to a family vacation in December at [facility] for skiing and sightseeing.  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  He has no psychosis, mania, delirium, or suicidality.   Individual Medical History/ Review of Systems: Changes? Yes noting urgent care visit for otitis externa with pain had blood pressure 140/90 on September 30 recheck October 16 at 124/76.  Weight is up 6 pounds over that of the  last 2 sessions.  allergies: Patient has no known allergies.  Current Medications:  Current Outpatient Medications:  .  cetirizine (ZYRTEC) 10 MG tablet, Take 10 mg by mouth as needed for allergies., Disp: , Rfl:  .  ibuprofen (ADVIL) 200 MG tablet, Take 200-400 mg by mouth as needed., Disp: , Rfl:  .  methylphenidate 36 MG PO CR tablet, Take 1 tablet (36 mg total) by mouth daily after breakfast., Disp: 30 tablet, Rfl: 0 .  [START ON 04/01/2019] methylphenidate 36 MG PO CR tablet, Take 1 tablet (36 mg total) by mouth daily after breakfast., Disp: 30 tablet, Rfl:  0 .  [START ON 05/01/2019] methylphenidate 36 MG PO CR tablet, Take 1 tablet (36 mg total) by mouth daily after breakfast., Disp: 30 tablet, Rfl: 0   Medication Side Effects: none  Family Medical/ Social History: Changes? No  MENTAL HEALTH EXAM:  Height 5\' 7"  (1.702 m), weight 206 lb (93.4 kg).Body mass index is 32.26 kg/m. Muscle strengths and tone 5/5, postural reflexes and gait 0/0, and AIMS = 0 otherwise deferred for coronavirus shutdown  General Appearance: Casual, Fairly Groomed, Meticulous and Obese  Eye Contact:  Good  Speech:  Clear and Coherent, Normal Rate and Talkative  Volume:  Normal  Mood:  Anxious, Dysphoric and Euthymic  Affect:  Inappropriate, Labile, Restricted and Anxious  Thought Process:  Coherent, Goal Directed, Irrelevant, Linear and Descriptions of Associations: Circumstantial  Orientation:  Full (Time, Place, and Person)  Thought Content: Obsessions and Rumination   Suicidal Thoughts:  No  Homicidal Thoughts:  No  Memory:  Immediate;   Good Remote;   Good  Judgement:  Fair  Insight:  Fair  Psychomotor Activity:  Normal and Mannerisms  Concentration:  Concentration: Good and Attention Span: Fair  Recall:  [employer] of Knowledge: Good  Language: Good  Assets:  Desire for Improvement Leisure Time Resilience Vocational/Educational  ADL's:  Intact  Cognition: WNL  Prognosis:  Good    DIAGNOSES:    ICD-10-CM   1. Attention deficit hyperactivity disorder (ADHD), inattentive type, moderate  F90.0 methylphenidate 36 MG PO CR  tablet    methylphenidate 36 MG PO CR tablet    methylphenidate 36 MG PO CR tablet  2. Generalized anxiety disorder  F41.1   3. 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 Developmental coordination disorder  F82     Receiving Psychotherapy: No Patient suggests closure with Elio Forget,  LPC   RECOMMENDATIONS: Psychosupportive psychoeducation is updated for prevention and monitoring especially weight and blood pressure and safety hygiene.  He is E scribed Concerta 36 mg every morning after breakfast as #30 each for October 21, November 20 and December 20 for [facility] [address].  He returns in 3 months for follow-up.  Chauncey Mann, MD

## 2019-03-22 ENCOUNTER — Other Ambulatory Visit: Payer: Self-pay

## 2019-03-22 ENCOUNTER — Emergency Department (HOSPITAL_COMMUNITY)
Admission: EM | Admit: 2019-03-22 | Discharge: 2019-03-22 | Disposition: A | Discharge status: Home / Self Care | Payer: BC Managed Care – PPO | Attending: Emergency Medicine | Admitting: Emergency Medicine

## 2019-03-22 ENCOUNTER — Emergency Department (HOSPITAL_COMMUNITY): Payer: BC Managed Care – PPO

## 2019-03-22 ENCOUNTER — Encounter (HOSPITAL_COMMUNITY): Payer: Self-pay

## 2019-03-22 DIAGNOSIS — N50811 Right testicular pain: Secondary | ICD-10-CM | POA: Diagnosis present

## 2019-03-22 DIAGNOSIS — N452 Orchitis: Secondary | ICD-10-CM | POA: Diagnosis not present

## 2019-03-22 DIAGNOSIS — Z79899 Other long term (current) drug therapy: Secondary | ICD-10-CM | POA: Insufficient documentation

## 2019-03-22 LAB — URINALYSIS, ROUTINE W REFLEX MICROSCOPIC
Bacteria, UA: NONE SEEN
Bilirubin Urine: NEGATIVE
Glucose, UA: NEGATIVE mg/dL
Hgb urine dipstick: NEGATIVE
Ketones, ur: NEGATIVE mg/dL
Nitrite: NEGATIVE
Protein, ur: NEGATIVE mg/dL
Specific Gravity, Urine: 1.011 (ref 1.005–1.030)
pH: 6 (ref 5.0–8.0)

## 2019-03-22 MED ORDER — AZITHROMYCIN 250 MG PO TABS
1000.0000 mg | ORAL_TABLET | Freq: Once | ORAL | Status: AC
  Administered 2019-03-22: 1000 mg via ORAL
  Filled 2019-03-22: qty 4

## 2019-03-22 MED ORDER — CEFTRIAXONE SODIUM 250 MG IJ SOLR
250.0000 mg | Freq: Once | INTRAMUSCULAR | Status: AC
  Administered 2019-03-22: 250 mg via INTRAMUSCULAR
  Filled 2019-03-22: qty 250

## 2019-03-22 NOTE — ED Provider Notes (Signed)
[facility] DEPT Provider Note   CSN: [id number] Arrival date & time: 03/22/19  2019     History   Chief Complaint Chief Complaint  Patient presents with  . Testicle Pain    HPI Dylan Vasquez is a 23 y.o. male.     HPI Patient presents with concern of right-sided scrotum pain. Onset was about 12 hours ago. Initially was mild discomfort about the right inferior pole of his hemiscrotum. Subsequently the pain has increased and is now approximately 4.2 out of a 10 scale. No associated dysuria, hematuria, left-sided scrotal pain, abdominal pain, nausea, vomiting, or other complaints. No medication taken for pain relief.     Past Medical History:  Diagnosis Date  . ADHD (attention deficit hyperactivity disorder)   . Allergic rhinitis due to pollen   . Anxiety   . GERD (gastroesophageal reflux disease)     Patient Active Problem List   Diagnosis Date Noted  . Attention deficit hyperactivity disorder (ADHD), inattentive type, moderate 01/06/2019  . Generalized anxiety disorder 01/06/2019  . Specific learning disorder, with impairment in reading, moderate 01/06/2019  . 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 Developmental coordination disorder 01/06/2019    Past Surgical History:  Procedure Laterality Date  . WISDOM TOOTH EXTRACTION Bilateral 2013        Home Medications    Prior to Admission medications   Medication Sig Start Date End Date Taking? Authorizing Provider  cetirizine (ZYRTEC) 10 MG tablet Take 10 mg by mouth as needed for allergies.   Yes [provider]  fluticasone (FLONASE) 50 MCG/ACT nasal spray Place 1 spray into the nose daily.   Yes [provider]  ibuprofen (ADVIL) 200 MG tablet Take 200-400 mg by mouth as needed.   Yes [provider]  methylphenidate 36 MG PO CR tablet Take 1 tablet (36 mg total) by mouth daily after breakfast.  03/02/19 04/01/19 Yes Delight Hoh, MD  methylphenidate 36 MG PO CR tablet Take 1 tablet (36 mg total) by mouth daily after breakfast. Patient not taking: Reported on 03/22/2019 04/01/19 05/01/19  Delight Hoh, MD  methylphenidate 36 MG PO CR tablet Take 1 tablet (36 mg total) by mouth daily after breakfast. Patient not taking: Reported on 03/22/2019 05/01/19 05/31/19  Delight Hoh, MD    Family History Family History  Problem Relation Age of Onset  . Heart disease Father   . High Cholesterol Father   . Hypertension Father   . Learning disabilities Father   . ADD / ADHD Father   . Diabetes Maternal Grandfather   . Early death Maternal Grandfather   . Heart attack Maternal Grandfather   . Heart disease Maternal Grandfather   . High Cholesterol Maternal Grandfather   . Alcohol abuse Paternal Grandfather   . Heart attack Paternal Grandfather   . Heart disease Paternal Grandfather   . Prostate cancer Neg Hx   . Colon cancer Neg Hx     Social History Social History   Tobacco Use  . Smoking status: Never Smoker  . Smokeless tobacco: Never Used  Substance Use Topics  . Alcohol use: Yes    Comment: 3-6 beers a week or liquor  . Drug use: Never     Allergies   Patient has no known allergies.   Review of Systems Review of Systems  Constitutional:       Per HPI, otherwise negative  HENT:  Per HPI, otherwise negative  Respiratory:       Per HPI, otherwise negative  Cardiovascular:       Per HPI, otherwise negative  Gastrointestinal: Negative for vomiting.  Endocrine:       Negative aside from HPI  Genitourinary:       Neg aside from HPI   Musculoskeletal:       Per HPI, otherwise negative  Skin: Negative.   Neurological: Negative for syncope.     Physical Exam Updated Vital Signs BP (!) 140/99   Pulse 86   Temp 99.2 F (37.3 C) (Oral)   Resp 16   SpO2 98%   Physical Exam Vitals signs and nursing note reviewed.  Constitutional:       General: He is not in acute distress.    Appearance: He is well-developed.  HENT:     Head: Normocephalic and atraumatic.  Eyes:     Conjunctiva/sclera: Conjunctivae normal.  Cardiovascular:     Rate and Rhythm: Normal rate and regular rhythm.  Pulmonary:     Effort: Pulmonary effort is normal. No respiratory distress.     Breath sounds: No stridor.  Abdominal:     General: There is no distension.  Genitourinary:    Penis: Normal.      Scrotum/Testes:        Right: Tenderness present. Right testis is descended.        Left: Tenderness not present. Left testis is descended.  Skin:    General: Skin is warm and dry.  Neurological:     Mental Status: He is alert and oriented to person, place, and time.      ED Treatments / Results  Labs (all labs ordered are listed, but only abnormal results are displayed) Labs Reviewed  URINALYSIS, ROUTINE W REFLEX MICROSCOPIC - Abnormal; Notable for the following components:      Result Value   Leukocytes,Ua TRACE (*)    All other components within normal limits   Radiology [place] Scrotum W/doppler  Result Date: 03/22/2019 CLINICAL DATA:  Testicle pain EXAM: SCROTAL ULTRASOUND DOPPLER ULTRASOUND OF THE TESTICLES TECHNIQUE: Complete ultrasound examination of the testicles, epididymis, and other scrotal structures was performed. Color and spectral Doppler ultrasound were also utilized to evaluate blood flow to the testicles. COMPARISON:  None. FINDINGS: Right testicle Measurements: 3.6 x 2.2 x 2.6 cm. No mass. Microlithiasis is present Left testicle Measurements: 3.5 x 2.4 x 2.4 cm. No mass. Microlithiasis is present Right epididymis:  Normal in size and appearance. Left epididymis:  Normal in size and appearance. Hydrocele:  Trace left hydrocele. Varicocele:  None visualized. Pulsed Doppler interrogation of both testes demonstrates normal low resistance arterial and venous waveforms bilaterally. IMPRESSION: 1. Negative for testicular torsion or  testicular mass. 2. There may be slight hyperemia to the right testis as may be seen with mild orchitis. 3. Trace left hydrocele 4. Testicular microlithiasis. Current literature suggests that testicular microlithiasis is not a significant independent risk factor for development of testicular carcinoma, and that follow up imaging is not warranted in the absence of other risk factors. Monthly testicular self-examination and annual physical exams are considered appropriate surveillance. If patient has other risk factors for testicular carcinoma, then referral to Urology should be considered. (Reference: DeCastro, et al.: A 5-Year Follow up Study of Asymptomatic Men with Testicular Microlithiasis. J Urol 2008; 179:1420-1423.) Electronically Signed   By: Jasmine PangKim  Fujinaga M.D.   On: 03/22/2019 21:11    Procedures Procedures (including critical care time)  Medications  Ordered in ED Medications  cefTRIAXone (ROCEPHIN) injection 250 mg (has no administration in time range)  azithromycin (ZITHROMAX) tablet 1,000 mg (has no administration in time range)     Initial Impression / Assessment and Plan / ED Course  I have reviewed the triage vital signs and the nursing notes.  Pertinent labs & imaging results that were available during my care of the patient were reviewed by me and considered in my medical decision making (see chart for details).    With concern for possible torsion the patient had stat bedside ultrasound. This did not demonstrate acute torsion.     10:26 PM Patient in no distress, sitting upright, I discussed all findings with him at length, including reassuring ultrasound, abnormal urinalysis, and patient confirms that he is sexually active.  On with this consideration of infectious orchitis, patient has received appropriate antibiotics, understands return precautions, home care instructions, discharged in stable condition.  Final Clinical Impressions(s) / ED Diagnoses   Final diagnoses:   Orchitis     Gerhard Munch, MD 03/22/19 2227

## 2019-03-22 NOTE — ED Triage Notes (Signed)
Pt sent by UC to r/o testicular torsion. Pt reports pain starting around 1p that has gradually worsened. UC provider stated that they tried manual maneuvers at their office, but were unsuccessful. No vomiting and pt is calm. Pain currently at a 5. States the R side hurts worse.

## 2019-03-22 NOTE — Discharge Instructions (Addendum)
As discussed, your evaluation today has been largely reassuring.  But, it is important that you monitor your condition carefully, and do not hesitate to return to the ED if you develop new, or concerning changes in your condition. ? ?Otherwise, please follow-up with your physician for appropriate ongoing care. ? ?

## 2019-03-22 NOTE — ED Notes (Signed)
Patient ambulated to RR without assistance, no discomfort noted.

## 2019-03-28 ENCOUNTER — Ambulatory Visit (INDEPENDENT_AMBULATORY_CARE_PROVIDER_SITE_OTHER): Payer: BC Managed Care – PPO | Admitting: Physician Assistant

## 2019-03-28 ENCOUNTER — Encounter: Payer: Self-pay | Admitting: Physician Assistant

## 2019-03-28 VITALS — Ht 67.0 in | Wt 205.0 lb

## 2019-03-28 DIAGNOSIS — N5089 Other specified disorders of the male genital organs: Secondary | ICD-10-CM | POA: Diagnosis not present

## 2019-03-28 DIAGNOSIS — Z8043 Family history of malignant neoplasm of testis: Secondary | ICD-10-CM

## 2019-03-28 NOTE — Progress Notes (Signed)
Virtual Visit via Video   I connected with Dylan Vasquez on 03/28/19 at 11:40 AM EST by a video enabled telemedicine application and verified that I am speaking with the correct person using two identifiers. Location patient: Home Location provider: [facility], Office Persons participating in the virtual visit: Karsyn, [name] PA-C, Anselmo Pickler, LPN   I discussed the limitations of evaluation and management by telemedicine and the availability of in person appointments. The patient expressed understanding and agreed to proceed.  I acted as a Education administrator for [employer], PA-C [employer], LPN  Subjective:   HPI:   ED follow up Pt was seen in the ED on 11/10 for Orchitis.  He presented with right sided scrotum pain.  Work-up included urinalysis and ultrasound with Doppler.  He was also treated with azithromycin and Rocephin to cover for potential infectious orchitis.  Since ER visit, he says he is feeling better, Rt sided scrotal pain has resolved.   Ultrasound from ER as below, it did show testicular microlithiasis.  He states that he does have a family history, specifically his maternal uncle, of testicular cancer.    CLINICAL DATA:  Testicle pain  EXAM: SCROTAL ULTRASOUND  DOPPLER ULTRASOUND OF THE TESTICLES  TECHNIQUE: Complete ultrasound examination of the testicles, epididymis, and other scrotal structures was performed. Color and spectral Doppler ultrasound were also utilized to evaluate blood flow to the testicles.  COMPARISON:  None.  FINDINGS: Right testicle  Measurements: 3.6 x 2.2 x 2.6 cm. No mass. Microlithiasis is present  Left testicle  Measurements: 3.5 x 2.4 x 2.4 cm. No mass. Microlithiasis is present  Right epididymis:  Normal in size and appearance.  Left epididymis:  Normal in size and appearance.  Hydrocele:  Trace left hydrocele.  Varicocele:  None visualized.  Pulsed Doppler interrogation of both  testes demonstrates normal low resistance arterial and venous waveforms bilaterally.  IMPRESSION: 1. Negative for testicular torsion or testicular mass. 2. There may be slight hyperemia to the right testis as may be seen with mild orchitis. 3. Trace left hydrocele 4. Testicular microlithiasis. Current literature suggests that testicular microlithiasis is not a significant independent risk factor for development of testicular carcinoma, and that follow up imaging is not warranted in the absence of other risk factors. Monthly testicular self-examination and annual physical exams are considered appropriate surveillance. If patient has other risk factors for testicular carcinoma, then referral to Urology should be considered. (Reference: DeCastro, et al.: A 5-Year Follow up Study of Asymptomatic Men with Testicular Microlithiasis. J Urol 2008; 202:5427-0623.)   Electronically Signed   By: Donavan Foil M.D.   On: 03/22/2019 21:11  ROS: See pertinent positives and negatives per HPI.  Patient Active Problem List   Diagnosis Date Noted  . Attention deficit hyperactivity disorder (ADHD), inattentive type, moderate 01/06/2019  . Generalized anxiety disorder 01/06/2019  . Specific learning disorder, with impairment in reading, moderate 01/06/2019  . 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 Smoking status: Never Smoker  . Smokeless tobacco: Never Used  Substance Use Topics  . Alcohol use: Yes    Comment: 3-6 beers a week or liquor    Current Outpatient Medications:  .  cetirizine (ZYRTEC) 10 MG tablet, Take 10 mg by mouth as needed for allergies., Disp: , Rfl:  .  fluticasone (FLONASE) 50 MCG/ACT nasal spray, Place 1 spray into the  nose daily., Disp: , Rfl:  .  ibuprofen (ADVIL) 200 MG tablet, Take 200-400 mg by mouth as needed., Disp: , Rfl:  .   methylphenidate 36 MG PO CR tablet, Take 1 tablet (36 mg total) by mouth daily after breakfast., Disp: 30 tablet, Rfl: 0 .  [START ON 04/01/2019] methylphenidate 36 MG PO CR tablet, Take 1 tablet (36 mg total) by mouth daily after breakfast., Disp: 30 tablet, Rfl: 0 .  [START ON 05/01/2019] methylphenidate 36 MG PO CR tablet, Take 1 tablet (36 mg total) by mouth daily after breakfast., Disp: 30 tablet, Rfl: 0  No Known Allergies  Objective:   VITALS: Per patient if applicable, see vitals. GENERAL: Alert, appears well and in no acute distress. HEENT: Atraumatic, conjunctiva clear, no obvious abnormalities on inspection of external nose and ears. NECK: Normal movements of the head and neck. CARDIOPULMONARY: No increased WOB. Speaking in clear sentences. I:E ratio WNL.  MS: Moves all visible extremities without noticeable abnormality. PSYCH: Pleasant and cooperative, well-groomed. Speech normal rate and rhythm. Affect is appropriate. Insight and judgement are appropriate. Attention is focused, linear, and appropriate.  NEURO: CN grossly intact. Oriented as arrived to appointment on time with no prompting. Moves both UE equally.  SKIN: No obvious lesions, wounds, erythema, or cyanosis noted on face or hands.  Assessment and Plan:   Dylan Vasquez was seen today for follow-up.  Diagnoses and all orders for this visit:  Testicular microlithiasis -     [facility]  Family history of testicular cancer -     [facility]   Although his symptoms have resolved, due to his family history and current age, will go ahead and send to [facility] for further evaluation of his ultrasound finding.  Patient verbalized understanding to plan.   . Reviewed expectations re: course of current medical issues. . Discussed self-management of symptoms. . Outlined signs and symptoms indicating need for more acute intervention. . Patient verbalized understanding and all questions were answered. [facility]  Maintenance issues including appropriate healthy diet, exercise, and smoking avoidance were discussed with patient. . See orders for this visit as documented in the electronic medical record.  I discussed the assessment and treatment plan with the patient. The patient was provided an opportunity to ask questions and all were answered. The patient agreed with the plan and demonstrated an understanding of the instructions.   The patient was advised to call back or seek an in-person evaluation if the symptoms worsen or if the condition fails to improve as anticipated.   CMA or LPN served as scribe during this visit. History, Physical, and Plan performed by medical provider. The above documentation has been reviewed and is accurate and complete.  [place], [place] 03/28/2019

## 2019-06-02 ENCOUNTER — Ambulatory Visit: Payer: BC Managed Care – PPO | Admitting: Psychiatry

## 2019-06-07 ENCOUNTER — Ambulatory Visit (INDEPENDENT_AMBULATORY_CARE_PROVIDER_SITE_OTHER): Payer: BC Managed Care – PPO | Admitting: Psychiatry

## 2019-06-07 ENCOUNTER — Encounter: Payer: Self-pay | Admitting: Psychiatry

## 2019-06-07 ENCOUNTER — Other Ambulatory Visit: Payer: Self-pay

## 2019-06-07 VITALS — Ht 67.0 in | Wt 214.0 lb

## 2019-06-07 DIAGNOSIS — F810 Specific reading disorder: Secondary | ICD-10-CM

## 2019-06-07 DIAGNOSIS — F82 Specific developmental disorder of motor function: Secondary | ICD-10-CM

## 2019-06-07 DIAGNOSIS — F8181 Disorder of written expression: Secondary | ICD-10-CM

## 2019-06-07 DIAGNOSIS — F411 Generalized anxiety disorder: Secondary | ICD-10-CM

## 2019-06-07 DIAGNOSIS — F900 Attention-deficit hyperactivity disorder, predominantly inattentive type: Secondary | ICD-10-CM

## 2019-06-07 MED ORDER — METHYLPHENIDATE HCL ER (OSM) 54 MG PO TBCR: 54.0000 mg | EXTENDED_RELEASE_TABLET | Freq: Every day | ORAL | 0 refills | Status: DC

## 2019-06-07 NOTE — Progress Notes (Signed)
[facility] Check  Patient ID: Dylan Vasquez,  MRN: [id number]  PCP: Inda Coke, PA  Date of Evaluation: 06/07/2019 Time spent:10 minutes from 1630 to 1640  Chief Complaint:  Chief Complaint    ADHD; Anxiety; Stress      HISTORY/CURRENT STATUS: Dylan Vasquez is seen onsite in office 10 minutes face-to-face individually with consent with epic collateral for psychiatric interview and exam in 83-monthevaluation and management of ADHD and generalized anxiety comorbid to learning disorders all progressively requiring more compensation as he continues [employer] 2 years remaining.  He had extensive Psych testing after a suicide attempt by carbon monoxide poisoning in [employer] seeming to be confident now that he knows what to work on but is easily divided in his attention to various tasks.  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  He has just [place] has little to say socially as he traumatically seeks to decide whether to increase Concerta when he has not been willing to take medication for anxiety such as  Pristiq, Trintellix, Zoloft, or Pamelor to improve his social applications.  He describes more performance anxiety when he feels somewhat under prepared for task at hand.  He has no suicidal ideation, psychosis, mania, or delirium.   registry documents last Concerta 36 mg dispensing 05/14/2019.   Individual Medical History/ Review of Systems: Changes? :Yes  as weight is up 8 pounds over the holiday and up 14 pounds over the last 5 months.  Dry mouth may partly be due to anxiety or Concerta though curiously he had testicular microlithiasis and orchitis in the interim he does not discuss otherwise.  Allergies: Patient has no known allergies.  Current Medications:  Current  Outpatient Medications:  .  cetirizine (ZYRTEC) 10 MG tablet, Take 10 mg by mouth as needed for allergies., Disp: , Rfl:  .  fluticasone (FLONASE) 50 MCG/ACT nasal spray, Place 1 spray into the nose daily., Disp: , Rfl:  .  ibuprofen (ADVIL) 200 MG tablet, Take 200-400 mg by mouth as needed., Disp: , Rfl:  .  methylphenidate 36 MG PO CR tablet, Take 1 tablet (36 mg total) by mouth daily after breakfast., Disp: 30 tablet, Rfl: 0 .  methylphenidate 36 MG PO CR tablet, Take 1 tablet (36 mg total) by mouth daily after breakfast., Disp: 30 tablet, Rfl: 0 .  methylphenidate 54 MG PO CR tablet, Take 1 tablet (54 mg total) by mouth daily after breakfast., Disp: 30 tablet, Rfl: 0   Medication Side Effects: xerostomia and weight gain  Family Medical/ Social History: Changes? No, denying any questions by family in the interim over holiday about his symptoms status and ways of coping relative to succeeding.  MENTAL HEALTH EXAM:  Height 5' 7"  (1.702 m), weight 214 lb (97.1 kg).Body mass index is 33.52 kg/m. Muscle strengths and tone 5/5, postural reflexes and gait 0/0, and AIMS = 0 otherwise deferred for coronavirus shutdown  General Appearance: Casual, Fairly Groomed, Meticulous and Obese  Eye Contact:  Fair  Speech:  Clear and Coherent, Normal Rate and Talkative  Volume:  Normal  Mood:  Anxious, Dysphoric and Euthymic  Affect:  Congruent, Constricted, Inappropriate, Restricted and Anxious  Thought Process:  Coherent, Irrelevant, Linear and Descriptions of Associations: Tangential  Orientation:  Full (Time, Place, and Person)  Thought  Content: Logical, Ilusions, Rumination and Tangential   Suicidal Thoughts:  No  Homicidal Thoughts:  No  Memory:  Immediate;   Fair to Good Remote;   Good to Fair   Judgement:  Fair  Insight:  Fair  Psychomotor Activity:  Normal and Decreased and Mannerisms  Concentration:  Concentration: Fair and Attention Span: Fair  Recall:  [employer] of Knowledge: Good   Language: Fair  Assets:  Leisure Time Resilience Talents/Skills  ADL's:  Intact  Cognition: WNL  Prognosis:  Fair    DIAGNOSES:    ICD-10-CM   1. Attention deficit hyperactivity disorder (ADHD), inattentive type, moderate  F90.0 methylphenidate 54 MG PO CR tablet  2. Generalized anxiety disorder  F41.1   3. 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 Developmental coordination disorder  F82     Receiving Psychotherapy: No    RECOMMENDATIONS: Psychosupportive psychoeducation addresses symptom treatment matching options for higher dose Concerta, psychotherapeutic intervention, and nonstimulant options such as  Pristiq, Trintellix, Zoloft, or Pamelor.  Prevention and monitoring and safety hygiene are outlined for school and community presence options.  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  He concludes that he Dylan Vasquez increase the Concerta to 54 mg every morning sent as #30 to [facility] [place].  He returns for follow-up in 1 month particularly to integrate performance on the internship with treatment management.  Delight Hoh, MD

## 2019-07-05 ENCOUNTER — Ambulatory Visit: Payer: BC Managed Care – PPO | Admitting: Psychiatry

## 2019-07-14 ENCOUNTER — Encounter: Payer: Self-pay | Admitting: Psychiatry

## 2019-07-14 ENCOUNTER — Ambulatory Visit (INDEPENDENT_AMBULATORY_CARE_PROVIDER_SITE_OTHER): Payer: BC Managed Care – PPO | Admitting: Psychiatry

## 2019-07-14 ENCOUNTER — Other Ambulatory Visit: Payer: Self-pay

## 2019-07-14 VITALS — Ht 67.0 in | Wt 210.0 lb

## 2019-07-14 DIAGNOSIS — F8181 Disorder of written expression: Secondary | ICD-10-CM

## 2019-07-14 DIAGNOSIS — F810 Specific reading disorder: Secondary | ICD-10-CM | POA: Diagnosis not present

## 2019-07-14 DIAGNOSIS — F82 Specific developmental disorder of motor function: Secondary | ICD-10-CM

## 2019-07-14 DIAGNOSIS — F411 Generalized anxiety disorder: Secondary | ICD-10-CM

## 2019-07-14 DIAGNOSIS — F900 Attention-deficit hyperactivity disorder, predominantly inattentive type: Secondary | ICD-10-CM

## 2019-07-14 MED ORDER — METHYLPHENIDATE HCL ER (OSM) 54 MG PO TBCR: 54.0000 mg | EXTENDED_RELEASE_TABLET | Freq: Every day | ORAL | 0 refills | Status: DC

## 2019-07-14 NOTE — Progress Notes (Signed)
[facility]  Patient ID: Dylan Vasquez,  MRN: [id number]  PCP: Inda Coke, PA  Date of Evaluation: 07/14/2019 Time spent:15 minutes from 1645 to 11700  Chief Complaint:  Chief Complaint    ADHD; Anxiety      HISTORY/CURRENT STATUS: Dylan Vasquez is seen onsite in office 15 minutes face-to-face individually with consent with epic collateral for psychiatric interview and exam and 5-week evaluation and management of ADHD and generalized anxiety with learning variances now in law school.  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  He reviews that he worked with his supervising attorney on several cases and that the other attorney and the office to call the criminal cases during this time.  He returns to the [employer] school onsite for the remainder of the semester into June expecting a busy schedule academically.  However he finds that his academic efficacy for executive function is improved on the increase Concerta 54 mg started at last appointment without increased anxiety or side effects.  [place] registry documents fill of the 54 mg on 06/07/2019 as his last dispensing.  He has no interim depressive symptoms including no suicide ideation.  Sleep is adequate and he has no mania, delirium, or psychosis.  Individual Medical History/ Review of Systems: Changes? :Yes  Weight is down 4 pounds in the last 5 weeks but still up 6 pounds from that of November.  He had negative COVID test twice in the interim for exposure scares in the internship.  Allergies: Patient has no known allergies.  Current Medications:  Current Outpatient Medications:  .  cetirizine (ZYRTEC) 10 MG tablet, Take 10 mg by mouth as needed for allergies., Disp: , Rfl:  .  fluticasone (FLONASE) 50 MCG/ACT nasal spray, Place 1 spray into  the nose daily., Disp: , Rfl:  .  ibuprofen (ADVIL) 200 MG tablet, Take 200-400 mg by mouth as needed., Disp: , Rfl:  .  methylphenidate 54 MG PO CR tablet, Take 1 tablet (54 mg total) by mouth daily after breakfast., Disp: 30 tablet, Rfl: 0 .  [START ON 08/13/2019] methylphenidate 54 MG PO CR tablet, Take 1 tablet (54 mg total) by mouth daily after breakfast., Disp: 30 tablet, Rfl: 0 .  [START ON 09/12/2019] methylphenidate 54 MG PO CR tablet, Take 1 tablet (54 mg total) by mouth daily after breakfast., Disp: 30 tablet, Rfl: 0 Medication Side Effects: none  Family Medical/ Social History: Changes? No  MENTAL HEALTH EXAM:  Height 5\' 7"  (1.702 m), weight 210 lb (95.3 kg).Body mass index is 32.89 kg/m. Muscle strengths and tone 5/5, postural reflexes and gait 0/0, and AIMS = 0.  Otherwise deferred for coronavirus shutdown  General Appearance: Casual, Guarded, Meticulous and Well Groomed  Eye Contact:  Good  Speech:  Clear and Coherent, Normal Rate and Talkative  Volume:  Normal  Mood:  Anxious and Euthymic  Affect:  Inappropriate, Full Range and Anxious  Thought Process:  Coherent, Goal Directed, Irrelevant, Linear and Descriptions of Associations: Circumstantial  Orientation:  Full (Time, Place, and Person)  Thought Content: Obsessions and Rumination   Suicidal Thoughts:  No  Homicidal Thoughts:  No  Memory:  Immediate;   Good Remote;   Good  Judgement:  Good  Insight:  Fair  Psychomotor Activity:  Normal  Concentration:  Concentration: Good and Attention Span: Fair  Recall:  [employer] of Knowledge: Good  Language: Good  Assets:  Leisure Time Resilience Talents/Skills  ADL's:  Intact  Cognition: WNL  Prognosis:  Good to fair    DIAGNOSES:    ICD-10-CM   1. Attention deficit hyperactivity disorder (ADHD), inattentive type, moderate  F90.0 methylphenidate 54 MG PO CR tablet    methylphenidate 54 MG PO CR tablet    methylphenidate 54 MG PO CR tablet  2. Generalized anxiety  disorder  F41.1   3. Developmental coordination disorder  F82   4. 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  He is E scribed Concerta 54 mg every morning sent as #30 each for March 4, April 3, and May 3 [facility] [address] for [facility].  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   Chauncey Mann, MD

## 2019-10-13 ENCOUNTER — Ambulatory Visit (INDEPENDENT_AMBULATORY_CARE_PROVIDER_SITE_OTHER): Payer: BC Managed Care – PPO | Admitting: Psychiatry

## 2019-10-13 ENCOUNTER — Other Ambulatory Visit: Payer: Self-pay

## 2019-10-13 ENCOUNTER — Encounter: Payer: Self-pay | Admitting: Psychiatry

## 2019-10-13 VITALS — Ht 67.0 in | Wt 218.0 lb

## 2019-10-13 DIAGNOSIS — F900 Attention-deficit hyperactivity disorder, predominantly inattentive type: Secondary | ICD-10-CM | POA: Diagnosis not present

## 2019-10-13 DIAGNOSIS — F82 Specific developmental disorder of motor function: Secondary | ICD-10-CM

## 2019-10-13 DIAGNOSIS — F810 Specific reading disorder: Secondary | ICD-10-CM

## 2019-10-13 DIAGNOSIS — F8181 Disorder of written expression: Secondary | ICD-10-CM | POA: Diagnosis not present

## 2019-10-13 DIAGNOSIS — F411 Generalized anxiety disorder: Secondary | ICD-10-CM | POA: Diagnosis not present

## 2019-10-13 MED ORDER — METHYLPHENIDATE HCL ER (OSM) 54 MG PO TBCR: 54.0000 mg | EXTENDED_RELEASE_TABLET | Freq: Every day | ORAL | 0 refills | Status: DC

## 2019-10-13 NOTE — Progress Notes (Signed)
[facility]  Patient ID: Dylan Vasquez,  MRN: [id number]  PCP: Dylan Motto, PA  Date of Evaluation: 10/13/2019 Time spent:20 minutes from 1120 to 1140  Chief Complaint:  Chief Complaint    ADHD; Anxiety      HISTORY/CURRENT STATUS: Dylan Vasquez is seen onsite in office 20 minutes face-to-face individually with consent with epic collateral for psychiatric interview and exam in 41-month evaluation and management of ADHD, generalized anxiety, and variant learning skills.  Dylan Vasquez has been diligent in participating in spring semester academic work at the [employer] of [employer].  He expects grades to be adequate noting some classes are better than others but finals are next week.  He Dylan Vasquez then be working toward preparations ultimately for his bar exams clarifying he needs full accommodations for testing.  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  [place] registry documents last fill for Concerta to have been 07/14/2019.  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 Patient plans to seek his license and bar exams with full disclosure of his health diagnoses and treatment expecting this office to get an email about diagnosis and medications clarification.  However, he notes the need for more sophisticated psychological retesting for full accommodations for his bar exam, requesting names of psychologists that might provide such for which he is educated on IT trainer and also Dr. Elpidio Vasquez.  He has no side effects from Concerta except reporting his appetite is reduced but his weight is up 8 pounds in the last 3 months suggesting no appetite reduction consequences.  He does have a history of allergic rhinitis with no medication allergies and GERD not currently requiring other  treatment.  He has no mania, suicidality, psychosis or delirium.  Anxiety  He presents for follow up visit. Onset was more than 9 years ago. The problem has been waxing and waning. Symptoms include  confusion, decreased concentration, dry mouth, excessive worry, insomnia, malaise, muscle tension, nausea, nervous/anxious behavior, obsessions and vigilance. Patient reports no compulsions, depressed mood, dizziness, chest pain,  feeling of choking,shortness of breath, palpitations, panic, hopelessness, guilty rumination, hyperventilation, impotence, irritability, restlessness or suicidal ideas. Symptoms occur most days. The severity of symptoms is moderate. The symptoms are aggravated by family issues, medication, social activities and work stress. The quality of sleep is fair. Nighttime awakenings: occasional. Risk factors include alcohol intake, a major life event, change in medication and family history. His past medical history is significant for anxiety/panic attacks and suicide attempts. There is no history of bipolar disorder or depression. Past treatments include SSRIs and counseling (CBT). The treatment provided mild relief. Compliance with prior treatments has been variable. Prior compliance problems include medication issues.   Individual Medical History/ Review of Systems: Changes? :Yes Weight is up 8 pounds attributing weight reduction from Concerta while also having GERD but wanting to increase Concerta in the future for bar exams.  Allergies: Patient has no known allergies.  Current Medications:  Current Outpatient Medications:  .  cetirizine (ZYRTEC) 10 MG tablet, Take 10 mg by mouth as needed for allergies., Disp: , Rfl:  .  fluticasone (FLONASE) 50 MCG/ACT nasal spray, Place 1 spray into the nose daily., Disp: , Rfl:  .  ibuprofen (ADVIL) 200 MG tablet,  Take 200-400 mg by mouth as needed., Disp: , Rfl:  .  methylphenidate 54 MG PO CR tablet, Take 1 tablet (54 mg total) by mouth daily  after breakfast., Disp: 30 tablet, Rfl: 0 .  [START ON 11/12/2019] methylphenidate 54 MG PO CR tablet, Take 1 tablet (54 mg total) by mouth daily after breakfast., Disp: 30 tablet, Rfl: 0 .  [START ON 12/12/2019] methylphenidate 54 MG PO CR tablet, Take 1 tablet (54 mg total) by mouth daily after breakfast., Disp: 30 tablet, Rfl: 0   Medication Side Effects: GI irritation  Family Medical/ Social History: Changes? No  MENTAL HEALTH EXAM:  Height 5\' 7"  (1.702 m), weight 218 lb (98.9 kg).Body mass index is 34.14 kg/m. Muscle strengths and tone 5/5, postural reflexes and gait 0/0, and AIMS = 0.  General Appearance: Casual, Fairly Groomed, Guarded, Meticulous and Obese  Eye Contact:  Good  Speech:  Clear and Coherent, Normal Rate and Talkative  Volume:  Normal  Mood:  Anxious and Euthymic  Affect:  Congruent, Inappropriate and Anxious  Thought Process:  Coherent, Goal Directed, Irrelevant, Linear and Descriptions of Associations: Circumstantial  Orientation:  Full (Time, Place, and Person)  Thought Content: Obsessions and Tangential   Suicidal Thoughts:  No  Homicidal Thoughts:  No  Memory:  Immediate;   Good Remote;   Good  Judgement:  Good  Insight:  Fair  Psychomotor Activity:  Normal and Mannerisms  Concentration:  Concentration: Good and Attention Span: Fair  Recall:  [employer] of Knowledge: Good  Language: Good  Assets:  Desire for Improvement Leisure Time Resilience Talents/Skills  ADL's:  Intact  Cognition: WNL  Prognosis:  Good    DIAGNOSES:    ICD-10-CM   1. Attention deficit hyperactivity disorder (ADHD), inattentive type, moderate  F90.0 methylphenidate 54 MG PO CR tablet    methylphenidate 54 MG PO CR tablet    methylphenidate 54 MG PO CR tablet  2. Generalized anxiety disorder  F41.1   3. Developmental coordination disorder  F82   4. 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 Specific learning disorder, with  impairment in reading, moderate  F81.0     Receiving Psychotherapy: No    RECOMMENDATIONS: Patient processes during psychosupportive psychoeducation his personal and professional scope of development for diagnoses and medications as he is completing law school and considering subsequent career.  He needs formal structured review of diagnoses for his full disclosure in process of licensing with state as well as seeking accommodations for bar exam.  He is not fully exhausting the sequential escriptions for Concerta 54 mg from last appointment dispensing 1 of 3 anticipates per [place] registry at the same time he discusses possibly higher dosing of Concerta as he approaches closure of law school and bar exams.  I doubt he Dylan Vasquez need a higher dose of Concerta, but cognitive behavioral sleep hygiene, nutrition, social problem-solving, and frustration management are integrated over 50% of the face-to-face session for 10 minutes total counseling and coordination of care with symptom treatment matching.  He is E scribed therefore new escriptions of Concerta 54 mg every morning sent as #30 each for June 3, July 3, and August 2 [facility] [place] [facility].  He has not yet required Zoloft, Effexor/Pristiq, or Cymbalta for anxiety his care here after Prozac years ago.  He returns for follow-up in 3 months or sooner if needed.  Delight Hoh, MD

## 2020-01-12 ENCOUNTER — Ambulatory Visit: Payer: BC Managed Care – PPO | Admitting: Psychiatry

## 2020-01-19 ENCOUNTER — Ambulatory Visit: Payer: BC Managed Care – PPO | Admitting: Psychiatry

## 2020-02-09 ENCOUNTER — Other Ambulatory Visit: Payer: Self-pay

## 2020-02-09 ENCOUNTER — Ambulatory Visit (INDEPENDENT_AMBULATORY_CARE_PROVIDER_SITE_OTHER): Payer: BC Managed Care – PPO | Admitting: Psychiatry

## 2020-02-09 ENCOUNTER — Encounter: Payer: Self-pay | Admitting: Psychiatry

## 2020-02-09 VITALS — Ht 67.0 in | Wt 218.0 lb

## 2020-02-09 DIAGNOSIS — F900 Attention-deficit hyperactivity disorder, predominantly inattentive type: Secondary | ICD-10-CM

## 2020-02-09 DIAGNOSIS — F810 Specific reading disorder: Secondary | ICD-10-CM | POA: Diagnosis not present

## 2020-02-09 DIAGNOSIS — F411 Generalized anxiety disorder: Secondary | ICD-10-CM | POA: Diagnosis not present

## 2020-02-09 DIAGNOSIS — F82 Specific developmental disorder of motor function: Secondary | ICD-10-CM

## 2020-02-09 DIAGNOSIS — F8181 Disorder of written expression: Secondary | ICD-10-CM

## 2020-02-09 MED ORDER — METHYLPHENIDATE HCL ER (OSM) 54 MG PO TBCR: 54.0000 mg | EXTENDED_RELEASE_TABLET | Freq: Every day | ORAL | 0 refills | Status: DC

## 2020-02-09 NOTE — Progress Notes (Signed)
[facility]  Patient ID: Dylan Vasquez,  MRN: [id number]  PCP: Jarold Motto, PA  Date of Evaluation: 02/09/2020 Time spent:15 minutes  from0930 to 0945 Chief Complaint:  Chief Complaint    ADHD; Anxiety      HISTORY/CURRENT STATUS: Dylan Vasquez is seen onsite in office 15 minutes face-to-face individually with consent with epic collateral for psychiatric interview and exam in 69-month evaluation and treatment of ADHD and generalized anxiety with learning deficits.  Dylan Vasquez considers that his summer went reasonably well and he is back to school in his senior year now.  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  He did have psychological testing in [place] predominantly psychometric confirming per patient his ADHD being conveyed to the board.  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  The Concerta has been used sparingly apparently having 2 fills remaining from 10/13/2019 [facility] [place] Registry reporting only 1 of these being dispensed 12/26/2019. 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  He has no mania, suicidality, psychosis or delirium.   Individual Medical History/ Review of Systems: Changes? :No   Allergies: Patient has no known allergies.  Current Medications:  Current Outpatient Medications:  .  cetirizine (ZYRTEC) 10 MG tablet, Take 10 mg by mouth as needed for allergies., Disp: , Rfl:  .  fluticasone (FLONASE) 50 MCG/ACT nasal spray, Place 1 spray into the nose daily., Disp: , Rfl:  .  ibuprofen (ADVIL) 200 MG tablet,  Take 200-400 mg by mouth as needed., Disp: , Rfl:  .  methylphenidate 54 MG PO CR tablet, Take 1 tablet (54 mg total) by mouth daily after breakfast., Disp: 30 tablet, Rfl: 0 .  methylphenidate 54 MG PO CR tablet, Take 1 tablet (54 mg total) by mouth daily after breakfast., Disp: 30 tablet, Rfl: 0 .  methylphenidate 54 MG PO CR tablet, Take 1 tablet (54 mg total) by mouth daily after breakfast., Disp: 30 tablet, Rfl: 0 .  [START ON 03/10/2020] methylphenidate 54 MG PO CR tablet, Take 1 tablet (54 mg total) by mouth daily after breakfast., Disp: 30 tablet, Rfl: 0 .  [START ON 04/09/2020] methylphenidate 54 MG PO CR tablet, Take 1 tablet (54 mg total) by mouth daily after breakfast., Disp: 30 tablet, Rfl: 0  Medication Side Effects: none  Family Medical/ Social History: Changes? No  MENTAL HEALTH EXAM:  Height 5\' 7"  (1.702 m), weight 218 lb (98.9 kg).Body mass index is 34.14 kg/m. Muscle strengths and tone 5/5, postural reflexes and gait 0/0, and AIMS = 0.  General Appearance: Casual, Fairly Groomed, Meticulous and Obese  Eye Contact:  Good  Speech:  Clear and Coherent, Normal Rate and Talkative  Volume:  Normal  Mood:  Anxious and Euthymic  Affect:  Congruent, Restricted and Anxious  Thought Process:  Coherent, Goal Directed, Linear and Descriptions of Associations: Tangential  Orientation:  Full (Time, Place, and Person)  Thought Content: Rumination and Tangential   Suicidal Thoughts:  No  Homicidal Thoughts:  No  Memory:  Immediate;   Good Remote;   Good  Judgement:  Good  Insight:  Fair  Psychomotor Activity:  Normal and Mannerisms  Concentration:  Concentration: Good and Attention Span: Fair  Recall:  [employer] of Knowledge: Good  Language: Good  Assets:  Desire for Improvement Resilience Talents/Skills Vocational/Educational  ADL's:  Intact  Cognition: WNL  Prognosis:  Good    DIAGNOSES:    ICD-10-CM   1. Attention deficit hyperactivity disorder (ADHD), inattentive  type, moderate  F90.0 methylphenidate 54 MG PO CR tablet    methylphenidate 54 MG PO CR tablet    methylphenidate 54 MG PO CR tablet  2. Generalized anxiety disorder  F41.1   3. 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 Developmental coordination disorder  F82   5. 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  Has 2 fills of Concerta 54 mg every morning remaining from 12/13/2019 appointment as he required little in the summer.  He is E scribed to subsequently continue Concerta 54 mg every morning after breakfast for ADHD sent as #30 each for September 30, October 30, and [facility] [address] [facility].  Dylan Vasquez be in 4 months with Corie Chiquito, PMHNP as possible.   Chauncey Mann, MD

## 2020-02-29 ENCOUNTER — Encounter: Payer: Self-pay | Admitting: Psychiatry

## 2020-06-11 ENCOUNTER — Ambulatory Visit (INDEPENDENT_AMBULATORY_CARE_PROVIDER_SITE_OTHER): Payer: BC Managed Care – PPO | Admitting: Adult Health

## 2020-06-11 ENCOUNTER — Encounter: Payer: Self-pay | Admitting: Adult Health

## 2020-06-11 DIAGNOSIS — F900 Attention-deficit hyperactivity disorder, predominantly inattentive type: Secondary | ICD-10-CM | POA: Diagnosis not present

## 2020-06-11 MED ORDER — METHYLPHENIDATE HCL ER (OSM) 54 MG PO TBCR: 54.0000 mg | EXTENDED_RELEASE_TABLET | Freq: Every day | ORAL | 0 refills | Status: DC

## 2020-06-11 NOTE — Progress Notes (Signed)
Dylan Vasquez [id number] Jan 12, 1996 25 y.o.  Subjective:   Patient ID:  Dylan Vasquez is a 25 y.o. (DOB December 01, 1995) male.  Chief Complaint: No chief complaint on file.   HPI Dylan Vasquez presents to the office today for follow-up of ADHD.  Describes mood today as "ok". Pleasant. Denies tearfulness. Mood symptoms - denies depression, anxiety, and irritability. Stating "I'm doing alright". 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 Stable interest and motivation. Taking medications as prescribed.  Energy levels stable. Active, exercises "some".  Enjoys some usual interests and activities. Single. Has a girlfriend. Spending time with family. Appetite adequate. Weight stable - 214 pounds. Sleeps well most nights. Averages 8 hours. Focus and concentration relatively stable. Completing tasks. Managing aspects of household. Recent Forensic psychologist. Denies SI or HI.  Denies AH or VH.  Previous medication trials: Denies  Graduated from [employer].   GAD-7   Flowsheet Row Office Visit from 03/19/2018 in [facility]  Total GAD-7 Score 14    PHQ2-9   Flowsheet Row Office Visit from 03/19/2018 in [facility]  PHQ-2 Total Score 2  PHQ-9 Total Score 19       Review of Systems:  Review of Systems  Musculoskeletal: Negative for gait problem.  Neurological: Negative for tremors.  Psychiatric/Behavioral:       Please refer to HPI    Medications: I have reviewed the patient's current medications.  Current Outpatient Medications  Medication Sig Dispense Refill  . cetirizine (ZYRTEC) 10 MG tablet Take 10 mg by mouth as needed for allergies.    . fluticasone (FLONASE) 50 MCG/ACT nasal spray Place 1 spray into the nose daily.    Marland Kitchen ibuprofen (ADVIL) 200 MG tablet Take 200-400 mg by mouth as needed.    . methylphenidate 54 MG PO CR tablet Take 1 tablet (54 mg total) by mouth daily after breakfast. 30 tablet 0  . methylphenidate 54  MG PO CR tablet Take 1 tablet (54 mg total) by mouth daily after breakfast. 30 tablet 0  . methylphenidate 54 MG PO CR tablet Take 1 tablet (54 mg total) by mouth daily after breakfast. 30 tablet 0  . [START ON 07/09/2020] methylphenidate 54 MG PO CR tablet Take 1 tablet (54 mg total) by mouth daily after breakfast. 30 tablet 0  . [START ON 08/06/2020] methylphenidate 54 MG PO CR tablet Take 1 tablet (54 mg total) by mouth daily after breakfast. 30 tablet 0   No current facility-administered medications for this visit.    Medication Side Effects: None  Allergies: No Known Allergies  Past Medical History:  Diagnosis Date  . ADHD (attention deficit hyperactivity disorder)   . Allergic rhinitis due to pollen   . Anxiety   . GERD (gastroesophageal reflux disease)     Family History  Problem Relation Age of Onset  . Heart disease Father   . High Cholesterol Father   . Hypertension Father   . Learning disabilities Father   . ADD / ADHD Father   . Diabetes Maternal Grandfather   . Early death Maternal Grandfather   . Heart attack Maternal Grandfather   . Heart disease Maternal Grandfather   . High Cholesterol Maternal Grandfather   . Alcohol abuse Paternal Grandfather   . Heart attack Paternal Grandfather   . Heart disease Paternal Grandfather   . Prostate cancer Neg Hx   . Colon cancer Neg Hx     Social History   Socioeconomic History  .  Marital status: Single    Spouse name: Not on file  . Number of children: Not on file  . Years of education: Not on file  . Highest education level: Bachelor's degree (e.g., BA, AB, BS)  Occupational History  . Occupation: Careers information officer  Tobacco Use  . Smoking status: Never Smoker  . Smokeless tobacco: Never Used  Vaping Use  . Vaping Use: Never used  Substance and Sexual Activity  . Alcohol use: Yes    Comment: 3-6 beers a week or liquor  . Drug use: Never  . Sexual activity: Yes  Other Topics Concern  . Not on file  Social History  Narrative   Will is a second year student at [employer] school approaching midway to completion with social relational and academic learning variances for which he seeks to compensate in order to ascertain career and future optimal for his interest and expertise.  He remains integrated with parents as father is securing a better job moving to [employer] leaving the college town [place] where patient attended [employer] for anthropology as undergraduate.. Will is diligent in securing his best support, opportunity, and compensation for vulnerability.  Though he has a broad differential diagnosis, he consolidates understanding and applications in the session to that most imperative for his current performance and quality of life and that which can be addressed if needed in the future should symptoms and consequences become more desperate.    Social Determinants of Health   Financial Resource Strain: Not on file  Food Insecurity: Not on file  Transportation Needs: Not on file  Physical Activity: Not on file  Stress: Not on file  Social Connections: Not on file  Intimate Partner Violence: Not on file    Past Medical History, Surgical history, Social history, and Family history were reviewed and updated as appropriate.   Please see review of systems for further details on the patient's review from today.   Objective:   Physical Exam:  There were no vitals taken for this visit.  Physical Exam Constitutional:      General: He is not in acute distress. Musculoskeletal:        General: No deformity.  Neurological:     Mental Status: He is alert and oriented to person, place, and time.     Coordination: Coordination normal.  Psychiatric:        Attention and Perception: Attention and perception normal. He does not perceive auditory or visual hallucinations.        Mood and Affect: Mood normal. Mood is not anxious or depressed. Affect is not labile, blunt, angry or inappropriate.        Speech:  Speech normal.        Behavior: Behavior normal.        Thought Content: Thought content normal. Thought content is not paranoid or delusional. Thought content does not include homicidal or suicidal ideation. Thought content does not include homicidal or suicidal plan.        Cognition and Memory: Cognition and memory normal.        Judgment: Judgment normal.     Comments: Insight intact     Lab Review:  No results found for: NA, K, CL, CO2, GLUCOSE, BUN, CREATININE, CALCIUM, PROT, ALBUMIN, AST, ALT, ALKPHOS, BILITOT, GFRNONAA, GFRAA  No results found for: WBC, RBC, HGB, HCT, PLT, MCV, MCH, MCHC, RDW, LYMPHSABS, MONOABS, EOSABS, BASOSABS  No results found for: POCLITH, LITHIUM   No results found for: PHENYTOIN, PHENOBARB, VALPROATE, CBMZ   .  res Assessment: Plan:    Plan:  PDMP reviewed  1. Methylphenidate 54mg  Cr  119/75 87  RTC 3 months  Patient advised to contact office with any questions, adverse effects, or acute worsening in signs and symptoms.  Discussed potential benefits, risks, and side effects of stimulants with patient to include increased heart rate, palpitations, insomnia, increased anxiety, increased irritability, or decreased appetite.  Instructed patient to contact office if experiencing any significant tolerability issues.     Diagnoses and all orders for this visit:  Attention deficit hyperactivity disorder (ADHD), inattentive type, moderate -     methylphenidate 54 MG PO CR tablet; Take 1 tablet (54 mg total) by mouth daily after breakfast. -     methylphenidate 54 MG PO CR tablet; Take 1 tablet (54 mg total) by mouth daily after breakfast. -     methylphenidate 54 MG PO CR tablet; Take 1 tablet (54 mg total) by mouth daily after breakfast.     Please see After Visit Summary for patient specific instructions.  No future appointments.  No orders of the defined types were placed in this encounter.   -------------------------------

## 2020-09-06 ENCOUNTER — Ambulatory Visit (INDEPENDENT_AMBULATORY_CARE_PROVIDER_SITE_OTHER): Payer: BC Managed Care – PPO | Admitting: Adult Health

## 2020-09-06 ENCOUNTER — Other Ambulatory Visit: Payer: Self-pay

## 2020-09-06 DIAGNOSIS — F489 Nonpsychotic mental disorder, unspecified: Secondary | ICD-10-CM

## 2020-09-06 DIAGNOSIS — Z0389 Encounter for observation for other suspected diseases and conditions ruled out: Secondary | ICD-10-CM

## 2020-09-06 NOTE — Progress Notes (Signed)
Late to appt. Rescheduled.

## 2020-09-07 ENCOUNTER — Ambulatory Visit (HOSPITAL_COMMUNITY)
Admission: RE | Admit: 2020-09-07 | Discharge: 2020-09-07 | Disposition: A | Discharge status: Home / Self Care | Payer: BC Managed Care – PPO | Source: Ambulatory Visit | Attending: Cardiology | Admitting: Cardiology

## 2020-09-07 ENCOUNTER — Ambulatory Visit (INDEPENDENT_AMBULATORY_CARE_PROVIDER_SITE_OTHER): Payer: BC Managed Care – PPO | Admitting: Physician Assistant

## 2020-09-07 ENCOUNTER — Ambulatory Visit (INDEPENDENT_AMBULATORY_CARE_PROVIDER_SITE_OTHER): Payer: BC Managed Care – PPO

## 2020-09-07 ENCOUNTER — Encounter: Payer: Self-pay | Admitting: Physician Assistant

## 2020-09-07 ENCOUNTER — Encounter (HOSPITAL_COMMUNITY): Payer: BC Managed Care – PPO

## 2020-09-07 VITALS — BP 110/70 | HR 95 | Temp 98.2°F | Ht 67.0 in | Wt 227.4 lb

## 2020-09-07 DIAGNOSIS — M79669 Pain in unspecified lower leg: Secondary | ICD-10-CM

## 2020-09-07 DIAGNOSIS — M79661 Pain in right lower leg: Secondary | ICD-10-CM | POA: Diagnosis present

## 2020-09-07 DIAGNOSIS — Z0001 Encounter for general adult medical examination with abnormal findings: Secondary | ICD-10-CM

## 2020-09-07 DIAGNOSIS — F900 Attention-deficit hyperactivity disorder, predominantly inattentive type: Secondary | ICD-10-CM

## 2020-09-07 DIAGNOSIS — E669 Obesity, unspecified: Secondary | ICD-10-CM | POA: Diagnosis not present

## 2020-09-07 LAB — CBC WITH DIFFERENTIAL/PLATELET
Basophils Absolute: 0 10*3/uL (ref 0.0–0.1)
Basophils Relative: 0.5 % (ref 0.0–3.0)
Eosinophils Absolute: 0.1 10*3/uL (ref 0.0–0.7)
Eosinophils Relative: 1.5 % (ref 0.0–5.0)
HCT: 42.6 % (ref 39.0–52.0)
Hemoglobin: 14.7 g/dL (ref 13.0–17.0)
Lymphocytes Relative: 28.2 % (ref 12.0–46.0)
Lymphs Abs: 1.6 10*3/uL (ref 0.7–4.0)
MCHC: 34.5 g/dL (ref 30.0–36.0)
MCV: 89.7 fl (ref 78.0–100.0)
Monocytes Absolute: 0.5 10*3/uL (ref 0.1–1.0)
Monocytes Relative: 9.1 % (ref 3.0–12.0)
Neutro Abs: 3.4 10*3/uL (ref 1.4–7.7)
Neutrophils Relative %: 60.7 % (ref 43.0–77.0)
Platelets: 308 10*3/uL (ref 150.0–400.0)
RBC: 4.75 Mil/uL (ref 4.22–5.81)
RDW: 13.3 % (ref 11.5–15.5)
WBC: 5.7 10*3/uL (ref 4.0–10.5)

## 2020-09-07 LAB — COMPREHENSIVE METABOLIC PANEL
ALT: 40 U/L (ref 0–53)
AST: 19 U/L (ref 0–37)
Albumin: 4.5 g/dL (ref 3.5–5.2)
Alkaline Phosphatase: 70 U/L (ref 39–117)
BUN: 12 mg/dL (ref 6–23)
CO2: 27 mEq/L (ref 19–32)
Calcium: 9.9 mg/dL (ref 8.4–10.5)
Chloride: 100 mEq/L (ref 96–112)
Creatinine, Ser: 0.79 mg/dL (ref 0.40–1.50)
GFR: 123.95 mL/min (ref >60.00)
Glucose, Bld: 91 mg/dL (ref 70–99)
Potassium: 4.6 mEq/L (ref 3.5–5.1)
Sodium: 135 mEq/L (ref 135–145)
Total Bilirubin: 0.3 mg/dL (ref 0.2–1.2)
Total Protein: 7.3 g/dL (ref 6.0–8.3)

## 2020-09-07 LAB — LIPID PANEL
Cholesterol: 215 mg/dL — ABNORMAL HIGH (ref 0–200)
HDL: 40.7 mg/dL (ref >39.00)
LDL Cholesterol: 140 mg/dL — ABNORMAL HIGH (ref 0–99)
NonHDL: 174.6
Total CHOL/HDL Ratio: 5
Triglycerides: 174 mg/dL — ABNORMAL HIGH (ref 0.0–149.0)
VLDL: 34.8 mg/dL (ref 0.0–40.0)

## 2020-09-07 NOTE — Patient Instructions (Addendum)
It was great to see you!  Xrays today to evaluate your shins. I'll be in touch with results.  Please proceed with ultrasound of your calf given ongoing symptoms. I'll be in touch with these results as well.  Please go to the lab for blood work.   Our office will call you with your results unless you have chosen to receive results via MyChart.  If your blood work is normal we will follow-up each year for physicals and as scheduled for chronic medical problems.  If anything is abnormal we will treat accordingly and get you in for a follow-up.  Take care,  [facility] Maintenance, Male Adopting a healthy lifestyle and getting preventive care are important in promoting health and wellness. Ask your health care provider about:  The right schedule for you to have regular tests and exams.  Things you can do on your own to prevent diseases and keep yourself healthy. What should I know about diet, weight, and exercise? Eat a healthy diet  Eat a diet that includes plenty of vegetables, fruits, low-fat dairy products, and lean protein.  Do not eat a lot of foods that are high in solid fats, added sugars, or sodium.   Maintain a healthy weight Body mass index (BMI) is a measurement that can be used to identify possible weight problems. It estimates body fat based on height and weight. Your health care provider can help determine your BMI and help you achieve or maintain a healthy weight. Get regular exercise Get regular exercise. This is one of the most important things you can do for your health. Most adults should:  Exercise for at least 150 minutes each week. The exercise should increase your heart rate and make you sweat (moderate-intensity exercise).  Do strengthening exercises at least twice a week. This is in addition to the moderate-intensity exercise.  Spend less time sitting. Even light physical activity can be beneficial. Watch cholesterol and blood lipids Have your  blood tested for lipids and cholesterol at 25 years of age, then have this test every 5 years. You may need to have your cholesterol levels checked more often if:  Your lipid or cholesterol levels are high.  You are older than 25 years of age.  You are at high risk for heart disease. What should I know about cancer screening? Many types of cancers can be detected early and may often be prevented. Depending on your health history and family history, you may need to have cancer screening at various ages. This may include screening for:  Colorectal cancer.  Prostate cancer.  Skin cancer.  Lung cancer. What should I know about heart disease, diabetes, and high blood pressure? Blood pressure and heart disease  High blood pressure causes heart disease and increases the risk of stroke. This is more likely to develop in people who have high blood pressure readings, are of African descent, or are overweight.  Talk with your health care provider about your target blood pressure readings.  Have your blood pressure checked: ? Every 3-5 years if you are 64-28 years of age. ? Every year if you are 79 years old or older.  If you are between the ages of 68 and 59 and are a current or former smoker, ask your health care provider if you should have a one-time screening for abdominal aortic aneurysm (AAA). Diabetes Have regular diabetes screenings. This checks your fasting blood sugar level. Have the screening done:  Once every three years  after age 98 if you are at a normal weight and have a low risk for diabetes.  More often and at a younger age if you are overweight or have a high risk for diabetes. What should I know about preventing infection? Hepatitis B If you have a higher risk for hepatitis B, you should be screened for this virus. Talk with your health care provider to find out if you are at risk for hepatitis B infection. Hepatitis C Blood testing is recommended for:  Everyone born  from [birth year] through 1965.  Anyone with known risk factors for hepatitis C. Sexually transmitted infections (STIs)  You should be screened each year for STIs, including gonorrhea and chlamydia, if: ? You are sexually active and are younger than 25 years of age. ? You are older than 25 years of age and your health care provider tells you that you are at risk for this type of infection. ? Your sexual activity has changed since you were last screened, and you are at increased risk for chlamydia or gonorrhea. Ask your health care provider if you are at risk.  Ask your health care provider about whether you are at high risk for HIV. Your health care provider may recommend a prescription medicine to help prevent HIV infection. If you choose to take medicine to prevent HIV, you should first get tested for HIV. You should then be tested every 3 months for as long as you are taking the medicine. Follow these instructions at home: Lifestyle  Do not use any products that contain nicotine or tobacco, such as cigarettes, e-cigarettes, and chewing tobacco. If you need help quitting, ask your health care provider.  Do not use street drugs.  Do not share needles.  Ask your health care provider for help if you need support or information about quitting drugs. Alcohol use  Do not drink alcohol if your health care provider tells you not to drink.  If you drink alcohol: ? Limit how much you have to 0-2 drinks a day. ? Be aware of how much alcohol is in your drink. In the [place] one drink equals one 12 oz bottle of beer (355 mL), one 5 oz glass of wine (148 mL), or one 1 oz glass of hard liquor (44 mL). General instructions  Schedule regular health, dental, and eye exams.  Stay current with your vaccines.  Tell your health care provider if: ? You often feel depressed. ? You have ever been abused or do not feel safe at home. Summary  Adopting a healthy lifestyle and getting preventive care are  important in promoting health and wellness.  Follow your health care provider's instructions about healthy diet, exercising, and getting tested or screened for diseases.  Follow your health care provider's instructions on monitoring your cholesterol and blood pressure. This information is not intended to replace advice given to you by your health care provider. Make sure you discuss any questions you have with your health care provider. Document Revised: 04/21/2018 Document Reviewed: 04/21/2018 Elsevier Patient Education  2021 [employer].

## 2020-09-07 NOTE — Progress Notes (Signed)
I acted as a Neurosurgeon for [employer], PA-C [employer], LPN  Subjective:    Dylan Vasquez is a 25 y.o. male and is here for a comprehensive physical exam.   HPI  Health Maintenance Due  Topic Date Due  . Hepatitis C Screening  Never done  . HPV VACCINES (1 - Male 2-dose series) Never done  . HIV Screening  Never done    Acute Concerns: R leg cramping -- has had R leg semi-cramping for the past 2 days; however, he states that this has been happening intermittently x 1 year. Does feel like it could be related to over-exercise. Feels like he is on top of his hydration. No family hx of bleeding or clotting disorder. Denies warmth, redness or swelling to calf. Pain is 7-8/10. Lasts for just a few minutes. Usually massages or moves his legs around to help his symptoms. Knees/legs pain -- feels like "shin splint"; more consistent on L leg than R leg. Exacerbated with skiing, squatting, sitting for long periods of time. He ran track in high school, symptoms started then. Went to student health clinic in [place] ~2017 for eval for this, they told him that he has patella tendonitis. Never had xrays. Approx once a month symptoms will be severe enough that he will take an ibuprofen. Waxing and waning over time.  Chronic Issues: ADHD -- managed by psych; currently on methylphenidate. Doing well with this.  Health Maintenance: Immunizations -- UTD Colonoscopy -- N/A PSA -- N/A Diet -- worst than it should be; no juice but will drink soda Caffeine intake -- not significant Sleep habits -- sleeping okay; snoring at times Exercise -- running club x few weeks Weight -- Weight: 227 lb 6.1 oz (103.1 kg)  Recent weight history Wt Readings from Last 10 Encounters:  09/07/20 227 lb 6.1 oz (103.1 kg)  03/28/19 205 lb (93 kg)  03/19/18 208 lb 8 oz (94.6 kg)   Body mass index is 35.61 kg/m. Mood -- overall stable Alcohol use -- occasional ETOH; 3-4 drinks per week Tobacco use -- never  smoker  Depression screen [facility] 2/9 09/07/2020  Decreased Interest 1  Down, Depressed, Hopeless 2  PHQ - 2 Score 3  Altered sleeping 0  Tired, decreased energy 1  Change in appetite 0  Feeling bad or failure about yourself  2  Trouble concentrating 0  Moving slowly or fidgety/restless 0  Suicidal thoughts 0  PHQ-9 Score 6  Difficult doing work/chores -    Other providers/specialists: Patient Care Team: Jarold Motto, [place] as PCP - General (Physician Assistant)    PMHx, SurgHx, SocialHx, Medications, and Allergies were reviewed in the Visit Navigator and updated as appropriate.   Past Medical History:  Diagnosis Date  . ADHD (attention deficit hyperactivity disorder)   . Allergic rhinitis due to pollen   . Anxiety   . GERD (gastroesophageal reflux disease)      Past Surgical History:  Procedure Laterality Date  . WISDOM TOOTH EXTRACTION Bilateral 2013     Family History  Problem Relation Age of Onset  . Heart disease Father   . High Cholesterol Father   . Hypertension Father   . Learning disabilities Father   . ADD / ADHD Father   . Diabetes Maternal Grandfather   . Early death Maternal Grandfather   . Heart attack Maternal Grandfather   . Heart disease Maternal Grandfather   . High Cholesterol Maternal Grandfather   . Alcohol abuse Paternal Grandfather   .  Heart attack Paternal Grandfather   . Heart disease Paternal Grandfather   . Prostate cancer Neg Hx   . Colon cancer Neg Hx     Social History   Tobacco Use  . Smoking status: Never Smoker  . Smokeless tobacco: Never Used  Vaping Use  . Vaping Use: Never used  Substance Use Topics  . Alcohol use: Yes    Comment: 3-6 beers a week or liquor  . Drug use: Never    Review of Systems:   Review of Systems  Constitutional: Negative for chills, fever, malaise/fatigue and weight loss.  HENT: Negative for hearing loss, sinus pain and sore throat.   Respiratory: Negative for cough and hemoptysis.    Cardiovascular: Negative for chest pain, palpitations, leg swelling and PND.  Gastrointestinal: Negative for abdominal pain, constipation, diarrhea, heartburn, nausea and vomiting.  Genitourinary: Negative for dysuria, frequency and urgency.  Musculoskeletal: Negative for back pain, myalgias and neck pain.  Skin: Negative for itching and rash.  Neurological: Negative for dizziness, tingling, seizures and headaches.  Endo/Heme/Allergies: Negative for polydipsia.  Psychiatric/Behavioral: Negative for depression. The patient is not nervous/anxious.     Objective:    Vitals:   09/07/20 1048  BP: 110/70  Pulse: 95  Temp: 98.2 F (36.8 C)  SpO2: 97%   Body mass index is 35.61 kg/m.  General  Alert, cooperative, no distress, appears stated age  Head:  Normocephalic, without obvious abnormality, atraumatic  Eyes:  PERRL, conjunctiva/corneas clear, EOM's intact, fundi benign, both eyes       Ears:  Normal TM's and external ear canals, both ears  Nose: Nares normal, septum midline, mucosa normal, no drainage or sinus tenderness  Throat: Lips, mucosa, and tongue normal; teeth and gums normal  Neck: Supple, symmetrical, trachea midline, no adenopathy;     thyroid:  No enlargement/tenderness/nodules; no carotid bruit or JVD  Back:   Symmetric, no curvature, ROM normal, no CVA tenderness  Lungs:   Clear to auscultation bilaterally, respirations unlabored  Chest wall:  No tenderness or deformity  Heart:  Regular rate and rhythm, S1 and S2 normal, no murmur, rub or gallop  Abdomen:   Soft, non-tender, bowel sounds active all four quadrants, no masses, no organomegaly  Extremities: Extremities normal, atraumatic, no cyanosis or edema R calf with point tenderness to calf; positive Homans sign on R; no warmth or erythema Bilateral anterior shins without skin changes or TTP Normal ROM  Prostate : Not done  Skin: Skin color, texture, turgor normal, no rashes or lesions  Lymph nodes:  Cervical, supraclavicular, and axillary nodes normal  Neurologic: CNII-XII grossly intact. Normal strength, sensation and reflexes throughout   AssessmentPlan:   Tesean was seen today for annual exam.  Diagnoses and all orders for this visit:  Encounter for general adult medical examination with abnormal findings Today patient counseled on age appropriate routine health concerns for screening and prevention, each reviewed and up to date or declined. Immunizations reviewed and up to date or declined. Labs ordered and reviewed. Risk factors for depression reviewed and negative. Hearing function and visual acuity are intact. ADLs screened and addressed as needed. Functional ability and level of safety reviewed and appropriate. Education, counseling and referrals performed based on assessed risks today. Patient provided with a copy of personalized plan for preventive services.  Pain in shin, unspecified laterality Possibly patellar tendonitis, but given duration of symptoms, will obtain xrays to make sure there are no other potential concerns. If xrays concerning or symptoms do  not improve with symptomatic care, will refer to sports medicine. -     DG Tibia/Fibula Left; Future -     DG Tibia/Fibula Right; Future  Right calf pain Will obtain u/s for further evaluation 2/2 point tenderness and +Homans sign. Further work-up/intervention based on results. -     VAS [place] LOWER EXTREMITY VENOUS (DVT); Future  Obesity, unspecified classification, unspecified obesity type, unspecified whether serious comorbidity present Encouraged regular diet and exercise as able. -     CBC with Differential/Platelet -     Comprehensive metabolic panel -     Lipid panel  Attention deficit hyperactivity disorder (ADHD), inattentive type, moderate Well controlled.    Management per psych.    Well Adult Exam: Labs ordered: Yes. Patient counseling was done. See below for items discussed.    Discussed the patient's  BMI.  The BMI BMI is not in the acceptable range; BMI management plan is completed   Follow up as needed for acute issues.   Patient Counseling: [x]   Nutrition: Stressed importance of moderation in sodium/caffeine intake, saturated fat and cholesterol, caloric balance, sufficient intake of fresh fruits, vegetables, and fiber  [x]   Stressed the importance of regular exercise.   []   Substance Abuse: Discussed cessation/primary prevention of tobacco, alcohol, or other drug use; driving or other dangerous activities under the influence; availability of treatment for abuse.   [x]   Injury prevention: Discussed safety belts, safety helmets, smoke detector, smoking near bedding or upholstery.   []   Sexuality: Discussed sexually transmitted diseases, partner selection, use of condoms, avoidance of unintended pregnancy  and contraceptive alternatives.   [x]   Dental health: Discussed importance of regular tooth brushing, flossing, and dental visits.  [x]   Health maintenance and immunizations reviewed. Please refer to Health maintenance section.    , PA-C [facility]

## 2020-09-21 ENCOUNTER — Other Ambulatory Visit: Payer: Self-pay

## 2020-09-21 ENCOUNTER — Ambulatory Visit (INDEPENDENT_AMBULATORY_CARE_PROVIDER_SITE_OTHER): Payer: BC Managed Care – PPO | Admitting: Adult Health

## 2020-09-21 ENCOUNTER — Encounter: Payer: Self-pay | Admitting: Adult Health

## 2020-09-21 DIAGNOSIS — F900 Attention-deficit hyperactivity disorder, predominantly inattentive type: Secondary | ICD-10-CM | POA: Diagnosis not present

## 2020-09-21 MED ORDER — METHYLPHENIDATE HCL ER (OSM) 54 MG PO TBCR: 54.0000 mg | EXTENDED_RELEASE_TABLET | ORAL | 0 refills | Status: DC

## 2020-09-21 MED ORDER — METHYLPHENIDATE HCL ER (OSM) 54 MG PO TBCR: 54.0000 mg | EXTENDED_RELEASE_TABLET | Freq: Every day | ORAL | 0 refills | Status: DC

## 2020-09-21 NOTE — Progress Notes (Signed)
Niam Nepomuceno [id number] 1996-01-02 25 y.o.  Subjective:   Patient ID:  Dylan Vasquez is a 25 y.o. (DOB 06-10-1995) male.  Chief Complaint: No chief complaint on file.   HPI Dylan Vasquez presents to the office today for follow-up of ADHD.  Describes mood today as "ok". Pleasant. Denies tearfulness. Mood symptoms - denies depression, anxiety, and irritability. Stating "I'm doing fine". 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 Feels like Concerta continues to work well for him. Stable interest and motivation. Taking medications as prescribed.  Energy levels stable. Active, walking 3 miles a week. Enjoys some usual interests and activities. Single. Has a girlfriend. Spending time with family. Appetite adequate. Weight stable - 214 pounds. Sleeps well most nights. Averages 8 hours. Focus and concentration relatively stable. Completing tasks. Managing aspects of household. Recent Forensic psychologist. Denies SI or HI.  Denies AH or VH.    GAD-7   Flowsheet Row Office Visit from 03/19/2018 in [facility]  Total GAD-7 Score 14    PHQ2-9   Flowsheet Row Office Visit from 09/07/2020 in [facility] Visit from 03/19/2018 in [facility]  PHQ-2 Total Score 3 2  PHQ-9 Total Score 6 19       Review of Systems:  Review of Systems  Musculoskeletal: Negative for gait problem.  Neurological: Negative for tremors.  Psychiatric/Behavioral:       Please refer to HPI    Medications: I have reviewed the patient's current medications.  Current Outpatient Medications  Medication Sig Dispense Refill  . [START ON 10/19/2020] methylphenidate 54 MG PO CR tablet Take 1 tablet (54 mg total) by mouth every morning. 30 tablet 0  . [START ON 11/16/2020] methylphenidate 54 MG PO CR tablet Take 1 tablet (54 mg total) by mouth every morning. 30 tablet 0  . cetirizine (ZYRTEC) 10 MG tablet Take 10 mg by mouth as needed for allergies.    .  fluticasone (FLONASE) 50 MCG/ACT nasal spray Place 1 spray into the nose daily.    Marland Kitchen ibuprofen (ADVIL) 200 MG tablet Take 200-400 mg by mouth as needed.    . methylphenidate 54 MG PO CR tablet Take 1 tablet (54 mg total) by mouth daily after breakfast. 30 tablet 0   No current facility-administered medications for this visit.    Medication Side Effects: None  Allergies: No Known Allergies  Past Medical History:  Diagnosis Date  . ADHD (attention deficit hyperactivity disorder)   . Allergic rhinitis due to pollen   . Anxiety   . GERD (gastroesophageal reflux disease)     Past Medical History, Surgical history, Social history, and Family history were reviewed and updated as appropriate.   Please see review of systems for further details on the patient's review from today.   Objective:   Physical Exam:  There were no vitals taken for this visit.  Physical Exam Constitutional:      General: He is not in acute distress. Musculoskeletal:        General: No deformity.  Neurological:     Mental Status: He is alert and oriented to person, place, and time.     Coordination: Coordination normal.  Psychiatric:        Attention and Perception: Attention and perception normal. He does not perceive auditory or visual hallucinations.        Mood and Affect: Mood normal. Mood is not anxious or depressed. Affect is not labile, blunt, angry or inappropriate.  Speech: Speech normal.        Behavior: Behavior normal.        Thought Content: Thought content normal. Thought content is not paranoid or delusional. Thought content does not include homicidal or suicidal ideation. Thought content does not include homicidal or suicidal plan.        Cognition and Memory: Cognition and memory normal.        Judgment: Judgment normal.     Comments: Insight intact     Lab Review:     Component Value Date/Time   NA 135 09/07/2020 1136   K 4.6 09/07/2020 1136   CL 100 09/07/2020 1136   CO2  27 09/07/2020 1136   GLUCOSE 91 09/07/2020 1136   BUN 12 09/07/2020 1136   CREATININE 0.79 09/07/2020 1136   CALCIUM 9.9 09/07/2020 1136   PROT 7.3 09/07/2020 1136   ALBUMIN 4.5 09/07/2020 1136   AST 19 09/07/2020 1136   ALT 40 09/07/2020 1136   ALKPHOS 70 09/07/2020 1136   BILITOT 0.3 09/07/2020 1136       Component Value Date/Time   WBC 5.7 09/07/2020 1136   RBC 4.75 09/07/2020 1136   HGB 14.7 09/07/2020 1136   HCT 42.6 09/07/2020 1136   PLT 308.0 09/07/2020 1136   MCV 89.7 09/07/2020 1136   MCHC 34.5 09/07/2020 1136   RDW 13.3 09/07/2020 1136   LYMPHSABS 1.6 09/07/2020 1136   MONOABS 0.5 09/07/2020 1136   EOSABS 0.1 09/07/2020 1136   BASOSABS 0.0 09/07/2020 1136    No results found for: POCLITH, LITHIUM   No results found for: PHENYTOIN, PHENOBARB, VALPROATE, CBMZ   .res Assessment: Plan:     Plan:  PDMP reviewed  1. Methylphenidate 54mg  Cr  107/75/94  RTC 3 months  Patient advised to contact office with any questions, adverse effects, or acute worsening in signs and symptoms.  Discussed potential benefits, risks, and side effects of stimulants with patient to include increased heart rate, palpitations, insomnia, increased anxiety, increased irritability, or decreased appetite.  Instructed patient to contact office if experiencing any significant tolerability issues.     Diagnoses and all orders for this visit:  Attention deficit hyperactivity disorder (ADHD), inattentive type, moderate -     methylphenidate 54 MG PO CR tablet; Take 1 tablet (54 mg total) by mouth daily after breakfast. -     methylphenidate 54 MG PO CR tablet; Take 1 tablet (54 mg total) by mouth every morning. -     methylphenidate 54 MG PO CR tablet; Take 1 tablet (54 mg total) by mouth every morning.     Please see After Visit Summary for patient specific instructions.  No future appointments.  No orders of the defined types were placed in this  encounter.   -------------------------------

## 2020-12-24 ENCOUNTER — Ambulatory Visit: Payer: BC Managed Care – PPO | Admitting: Adult Health

## 2021-01-07 ENCOUNTER — Ambulatory Visit: Payer: BC Managed Care – PPO | Admitting: Adult Health

## 2021-01-23 ENCOUNTER — Ambulatory Visit: Payer: BC Managed Care – PPO | Admitting: Adult Health

## 2021-01-23 NOTE — Progress Notes (Signed)
Patient no show appointment. ? ?

## 2021-02-07 ENCOUNTER — Ambulatory Visit: Payer: BC Managed Care – PPO | Admitting: Adult Health

## 2021-02-07 ENCOUNTER — Other Ambulatory Visit: Payer: Self-pay

## 2021-02-07 ENCOUNTER — Encounter: Payer: Self-pay | Admitting: Adult Health

## 2021-02-07 DIAGNOSIS — F900 Attention-deficit hyperactivity disorder, predominantly inattentive type: Secondary | ICD-10-CM

## 2021-02-07 MED ORDER — METHYLPHENIDATE HCL ER (OSM) 54 MG PO TBCR: 54.0000 mg | EXTENDED_RELEASE_TABLET | ORAL | 0 refills | Status: DC

## 2021-02-07 MED ORDER — METHYLPHENIDATE HCL ER (OSM) 54 MG PO TBCR: 54.0000 mg | EXTENDED_RELEASE_TABLET | Freq: Every morning | ORAL | 0 refills | Status: DC

## 2021-02-07 NOTE — Progress Notes (Signed)
[name] [id number] 03/22/1996 25 y.o.  Subjective:   Patient ID:  Dylan Vasquez is a 25 y.o. (DOB 1996/05/09) male.  Chief Complaint: No chief complaint on file.   HPI [name] presents to the office today for follow-up of ADHD.  Describes mood today as "ok". Pleasant. Denies tearfulness. Mood symptoms - denies depression, anxiety, and irritability. Stating "I'm doing ok". Recently passed bar exam. Has been on 3 interviews and has 2 more scheduled. Feels like Concerta continues to work well for him. Stable interest and motivation. Taking medications as prescribed.  Energy levels stable. Active, walking 4 miles a week. Enjoys some usual interests and activities. Dating. Has a girlfriend. Spending time with family. Appetite adequate. Weight stable - 214 pounds. Sleeps well most nights. Averages 8 hours. Focus and concentration relatively stable. Completing tasks. Managing aspects of household. 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 Denies SI or HI.  Denies AH or VH.   GAD-7    Flowsheet Row Office Visit from 03/19/2018 in [facility]  Total GAD-7 Score 14      PHQ2-9    Flowsheet Row Office Visit from 09/07/2020 in [facility] Visit from 03/19/2018 in [facility]  PHQ-2 Total Score 3 2  PHQ-9 Total Score 6 19        Review of Systems:  Review of Systems  Musculoskeletal:  Negative for gait problem.  Neurological:  Negative for tremors.  Psychiatric/Behavioral:         Please refer to HPI   Medications: I have reviewed the patient's current medications.  Current Outpatient Medications  Medication Sig Dispense Refill   cetirizine (ZYRTEC) 10 MG tablet Take 10 mg by mouth as needed for allergies.     fluticasone (FLONASE) 50 MCG/ACT nasal spray Place 1 spray into the nose daily.     ibuprofen (ADVIL) 200 MG tablet Take 200-400 mg by mouth as needed.     methylphenidate 54 MG PO CR tablet  Take 1 tablet (54 mg total) by mouth in the morning. 30 tablet 0   [START ON 03/07/2021] methylphenidate 54 MG PO CR tablet Take 1 tablet (54 mg total) by mouth every morning. 30 tablet 0   [START ON 04/04/2021] methylphenidate 54 MG PO CR tablet Take 1 tablet (54 mg total) by mouth every morning. 30 tablet 0   No current facility-administered medications for this visit.    Medication Side Effects: None  Allergies: No Known Allergies  Past Medical History:  Diagnosis Date   ADHD (attention deficit hyperactivity disorder)    Allergic rhinitis due to pollen    Anxiety    GERD (gastroesophageal reflux disease)     Past Medical History, Surgical history, Social history, and Family history were reviewed and updated as appropriate.   Please see review of systems for further details on the patient's review from today.   Objective:   Physical Exam:  There were no vitals taken for this visit.  Physical Exam Constitutional:      General: He is not in acute distress. Musculoskeletal:        General: No deformity.  Neurological:     Mental Status: He is alert and oriented to person, place, and time.     Coordination: Coordination normal.  Psychiatric:        Attention and Perception: Attention and perception normal. He does not perceive auditory or visual hallucinations.        Mood and Affect: Mood normal. Mood  is not anxious or depressed. Affect is not labile, blunt, angry or inappropriate.        Speech: Speech normal.        Behavior: Behavior normal.        Thought Content: Thought content normal. Thought content is not paranoid or delusional. Thought content does not include homicidal or suicidal ideation. Thought content does not include homicidal or suicidal plan.        Cognition and Memory: Cognition and memory normal.        Judgment: Judgment normal.     Comments: Insight intact    Lab Review:     Component Value Date/Time   NA 135 09/07/2020 1136   K 4.6 09/07/2020  1136   CL 100 09/07/2020 1136   CO2 27 09/07/2020 1136   GLUCOSE 91 09/07/2020 1136   BUN 12 09/07/2020 1136   CREATININE 0.79 09/07/2020 1136   CALCIUM 9.9 09/07/2020 1136   PROT 7.3 09/07/2020 1136   ALBUMIN 4.5 09/07/2020 1136   AST 19 09/07/2020 1136   ALT 40 09/07/2020 1136   ALKPHOS 70 09/07/2020 1136   BILITOT 0.3 09/07/2020 1136       Component Value Date/Time   WBC 5.7 09/07/2020 1136   RBC 4.75 09/07/2020 1136   HGB 14.7 09/07/2020 1136   HCT 42.6 09/07/2020 1136   PLT 308.0 09/07/2020 1136   MCV 89.7 09/07/2020 1136   MCHC 34.5 09/07/2020 1136   RDW 13.3 09/07/2020 1136   LYMPHSABS 1.6 09/07/2020 1136   MONOABS 0.5 09/07/2020 1136   EOSABS 0.1 09/07/2020 1136   BASOSABS 0.0 09/07/2020 1136    No results found for: POCLITH, LITHIUM   No results found for: PHENYTOIN, PHENOBARB, VALPROATE, CBMZ   .res Assessment: Plan:     Plan:  PDMP reviewed  1. Methylphenidate 54mg  CR  112/62-85  RTC 3 months  Patient advised to contact office with any questions, adverse effects, or acute worsening in signs and symptoms.  Discussed potential benefits, risks, and side effects of stimulants with patient to include increased heart rate, palpitations, insomnia, increased anxiety, increased irritability, or decreased appetite.  Instructed patient to contact office if experiencing any significant tolerability issues.  Diagnoses and all orders for this visit:  Attention deficit hyperactivity disorder (ADHD), inattentive type, moderate -     methylphenidate 54 MG PO CR tablet; Take 1 tablet (54 mg total) by mouth in the morning. -     methylphenidate 54 MG PO CR tablet; Take 1 tablet (54 mg total) by mouth every morning. -     methylphenidate 54 MG PO CR tablet; Take 1 tablet (54 mg total) by mouth every morning.    Please see After Visit Summary for patient specific instructions.  Future Appointments  Date Time Provider Department Center  05/09/2021  2:00 PM  Jovi Zavadil, 05/11/2021, NP [facility] None    No orders of the defined types were placed in this encounter.   -------------------------------

## 2021-05-09 ENCOUNTER — Encounter: Payer: Self-pay | Admitting: Adult Health

## 2021-05-09 ENCOUNTER — Other Ambulatory Visit: Payer: Self-pay

## 2021-05-09 ENCOUNTER — Ambulatory Visit: Payer: BC Managed Care – PPO | Admitting: Adult Health

## 2021-05-09 DIAGNOSIS — F900 Attention-deficit hyperactivity disorder, predominantly inattentive type: Secondary | ICD-10-CM | POA: Diagnosis not present

## 2021-05-09 MED ORDER — METHYLPHENIDATE HCL ER (OSM) 54 MG PO TBCR: 54.0000 mg | EXTENDED_RELEASE_TABLET | ORAL | 0 refills | Status: DC

## 2021-05-09 MED ORDER — METHYLPHENIDATE HCL ER (OSM) 54 MG PO TBCR: 54.0000 mg | EXTENDED_RELEASE_TABLET | Freq: Every morning | ORAL | 0 refills | Status: DC

## 2021-05-09 NOTE — Progress Notes (Signed)
Dylan Vasquez [id number] 01-17-1996 25 y.o.  Subjective:   Patient ID:  Dylan Vasquez is a 25 y.o. (DOB [birth year]) male.  Chief Complaint: No chief complaint on file.   HPI Dylan Vasquez presents to the office today for follow-up of ADHD.  Describes mood today as "ok". Pleasant. Denies tearfulness. Mood symptoms - denies depression and irritability. Reports some generalized anxiety over small staff. Stating "I'm doing ok". Feels like Concerta continues to work well for him. Stable interest and motivation. Taking medications as prescribed.  Energy levels stable. Active, walking 4 miles a week. Enjoys some usual interests and activities. Has a girlfriend of 2 and 1/2 years. Spending time with family. Appetite adequate. Weight stable - 214 pounds. Sleeps well most nights. Averages 6 to 7 hours. Focus and concentration stable. Completing tasks. Managing aspects of household. 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 Denies SI or HI.  Denies AH or VH.    GAD-7    Flowsheet Row Office Visit from 03/19/2018 in [facility]  Total GAD-7 Score 14      PHQ2-9    Flowsheet Row Office Visit from 09/07/2020 in [facility] [facility] Visit from 03/19/2018 in [place] [facility]  PHQ-2 Total Score 3 2  PHQ-9 Total Score 6 19        Review of Systems:  Review of Systems  Musculoskeletal:  Negative for gait problem.  Neurological:  Negative for tremors.  Psychiatric/Behavioral:         Please refer to HPI   Medications: I have reviewed the patient's current medications.  Current Outpatient Medications  Medication Sig Dispense Refill   cetirizine (ZYRTEC) 10 MG tablet Take 10 mg by mouth as needed for allergies.     fluticasone (FLONASE) 50 MCG/ACT nasal spray Place 1 spray into the nose daily.     ibuprofen (ADVIL) 200 MG tablet Take 200-400 mg by mouth as needed.     methylphenidate 54 MG PO CR tablet Take 1 tablet (54 mg total) by mouth  in the morning. 30 tablet 0   methylphenidate 54 MG PO CR tablet Take 1 tablet (54 mg total) by mouth every morning. 30 tablet 0   methylphenidate 54 MG PO CR tablet Take 1 tablet (54 mg total) by mouth every morning. 30 tablet 0   No current facility-administered medications for this visit.    Medication Side Effects: None  Allergies: No Known Allergies  Past Medical History:  Diagnosis Date   ADHD (attention deficit hyperactivity disorder)    Allergic rhinitis due to pollen    Anxiety    GERD (gastroesophageal reflux disease)     Past Medical History, Surgical history, Social history, and Family history were reviewed and updated as appropriate.   Please see review of systems for further details on the patient's review from today.   Objective:   Physical Exam:  There were no vitals taken for this visit.  Physical Exam Constitutional:      General: He is not in acute distress. Musculoskeletal:        General: No deformity.  Neurological:     Mental Status: He is alert and oriented to person, place, and time.     Coordination: Coordination normal.  Psychiatric:        Attention and Perception: Attention and perception normal. He does not perceive auditory or visual hallucinations.        Mood and Affect: Mood normal. Mood is not anxious or depressed. Affect is not labile,  blunt, angry or inappropriate.        Speech: Speech normal.        Behavior: Behavior normal.        Thought Content: Thought content normal. Thought content is not paranoid or delusional. Thought content does not include homicidal or suicidal ideation. Thought content does not include homicidal or suicidal plan.        Cognition and Memory: Cognition and memory normal.        Judgment: Judgment normal.     Comments: Insight intact    Lab Review:     Component Value Date/Time   NA 135 09/07/2020 1136   K 4.6 09/07/2020 1136   CL 100 09/07/2020 1136   CO2 27 09/07/2020 1136   GLUCOSE 91  09/07/2020 1136   BUN 12 09/07/2020 1136   CREATININE 0.79 09/07/2020 1136   CALCIUM 9.9 09/07/2020 1136   PROT 7.3 09/07/2020 1136   ALBUMIN 4.5 09/07/2020 1136   AST 19 09/07/2020 1136   ALT 40 09/07/2020 1136   ALKPHOS 70 09/07/2020 1136   BILITOT 0.3 09/07/2020 1136       Component Value Date/Time   WBC 5.7 09/07/2020 1136   RBC 4.75 09/07/2020 1136   HGB 14.7 09/07/2020 1136   HCT 42.6 09/07/2020 1136   PLT 308.0 09/07/2020 1136   MCV 89.7 09/07/2020 1136   MCHC 34.5 09/07/2020 1136   RDW 13.3 09/07/2020 1136   LYMPHSABS 1.6 09/07/2020 1136   MONOABS 0.5 09/07/2020 1136   EOSABS 0.1 09/07/2020 1136   BASOSABS 0.0 09/07/2020 1136    No results found for: POCLITH, LITHIUM   No results found for: PHENYTOIN, PHENOBARB, VALPROATE, CBMZ   .res Assessment: Plan:    Plan:  PDMP reviewed  1. Methylphenidate 54mg  CR  115/80/84  Patient moving to [facility]  Patient advised to contact office with any questions, adverse effects, or acute worsening in signs and symptoms.  Discussed potential benefits, risks, and side effects of stimulants with patient to include increased heart rate, palpitations, insomnia, increased anxiety, increased irritability, or decreased appetite.  Instructed patient to contact office if experiencing any significant tolerability issues.   There are no diagnoses linked to this encounter.   Please see After Visit Summary for patient specific instructions.  No future appointments.  No orders of the defined types were placed in this encounter.   -------------------------------

## 2021-10-31 ENCOUNTER — Ambulatory Visit: Payer: BC Managed Care – PPO | Admitting: Adult Health

## 2021-11-13 ENCOUNTER — Encounter: Payer: Self-pay | Admitting: Adult Health

## 2021-11-13 ENCOUNTER — Ambulatory Visit: Payer: BC Managed Care – PPO | Admitting: Adult Health

## 2021-11-13 DIAGNOSIS — F900 Attention-deficit hyperactivity disorder, predominantly inattentive type: Secondary | ICD-10-CM

## 2021-11-13 MED ORDER — METHYLPHENIDATE HCL ER (OSM) 54 MG PO TBCR: 54.0000 mg | EXTENDED_RELEASE_TABLET | Freq: Every morning | ORAL | 0 refills | Status: DC

## 2021-11-13 MED ORDER — METHYLPHENIDATE HCL ER (OSM) 54 MG PO TBCR: 54.0000 mg | EXTENDED_RELEASE_TABLET | ORAL | 0 refills | Status: DC

## 2021-11-13 NOTE — Progress Notes (Signed)
Dylan Vasquez [id number] 1996-01-29 26 y.o.  Subjective:   Patient ID:  Dylan Vasquez is a 26 y.o. (DOB 1995-05-23) male.  Chief Complaint: No chief complaint on file.   HPI Dylan Vasquez presents to the office today for follow-up of ADHD.  Describes mood today as "ok". Pleasant. Denies tearfulness. Mood symptoms - denies depression, anxiety and irritability. Mood is consistent. Stating "I'm doing ok". Feels like Concerta continues to work well for him. Stable interest and motivation. Taking medications as prescribed.  Energy levels stable. Active, does not have a regular exercise routine. Enjoys some usual interests and activities. Lives with girlfriend. Spending time with family. Appetite adequate. Weight gain - 220 to 230 pounds. Sleeps well most nights. Averages 6 to 7 hours. Focus and concentration stable. Completing tasks. Managing aspects of household. Works full time Dance movement psychotherapist. Denies SI or HI.  Denies AH or VH.   GAD-7    Flowsheet Row Office Visit from 03/19/2018 in [facility]  Total GAD-7 Score 14      PHQ2-9    Flowsheet Row Office Visit from 09/07/2020 in [facility] Visit from 03/19/2018 in [facility] [facility]  PHQ-2 Total Score 3 2  PHQ-9 Total Score 6 19        Review of Systems:  Review of Systems  Musculoskeletal:  Negative for gait problem.  Neurological:  Negative for tremors.  Psychiatric/Behavioral:         Please refer to HPI    Medications: I have reviewed the patient's current medications.  Current Outpatient Medications  Medication Sig Dispense Refill   cetirizine (ZYRTEC) 10 MG tablet Take 10 mg by mouth as needed for allergies.     fluticasone (FLONASE) 50 MCG/ACT nasal spray Place 1 spray into the nose daily.     ibuprofen (ADVIL) 200 MG tablet Take 200-400 mg by mouth as needed.     methylphenidate 54 MG PO CR tablet Take 1 tablet (54 mg total) by mouth in the  morning. 30 tablet 0   [START ON 12/11/2021] methylphenidate 54 MG PO CR tablet Take 1 tablet (54 mg total) by mouth every morning. 30 tablet 0   [START ON 01/08/2022] methylphenidate 54 MG PO CR tablet Take 1 tablet (54 mg total) by mouth every morning. 30 tablet 0   No current facility-administered medications for this visit.    Medication Side Effects: None  Allergies: No Known Allergies  Past Medical History:  Diagnosis Date   ADHD (attention deficit hyperactivity disorder)    Allergic rhinitis due to pollen    Anxiety    GERD (gastroesophageal reflux disease)     Past Medical History, Surgical history, Social history, and Family history were reviewed and updated as appropriate.   Please see review of systems for further details on the patient's review from today.   Objective:   Physical Exam:  There were no vitals taken for this visit.  Physical Exam Constitutional:      General: He is not in acute distress. Musculoskeletal:        General: No deformity.  Neurological:     Mental Status: He is alert and oriented to person, place, and time.     Coordination: Coordination normal.  Psychiatric:        Attention and Perception: Attention and perception normal. He does not perceive auditory or visual hallucinations.        Mood and Affect: Mood normal. Mood is not anxious or depressed. Affect is  not labile, blunt, angry or inappropriate.        Speech: Speech normal.        Behavior: Behavior normal.        Thought Content: Thought content normal. Thought content is not paranoid or delusional. Thought content does not include homicidal or suicidal ideation. Thought content does not include homicidal or suicidal plan.        Cognition and Memory: Cognition and memory normal.        Judgment: Judgment normal.     Comments: Insight intact     Lab Review:     Component Value Date/Time   NA 135 09/07/2020 1136   K 4.6 09/07/2020 1136   CL 100 09/07/2020 1136   CO2 27  09/07/2020 1136   GLUCOSE 91 09/07/2020 1136   BUN 12 09/07/2020 1136   CREATININE 0.79 09/07/2020 1136   CALCIUM 9.9 09/07/2020 1136   PROT 7.3 09/07/2020 1136   ALBUMIN 4.5 09/07/2020 1136   AST 19 09/07/2020 1136   ALT 40 09/07/2020 1136   ALKPHOS 70 09/07/2020 1136   BILITOT 0.3 09/07/2020 1136       Component Value Date/Time   WBC 5.7 09/07/2020 1136   RBC 4.75 09/07/2020 1136   HGB 14.7 09/07/2020 1136   HCT 42.6 09/07/2020 1136   PLT 308.0 09/07/2020 1136   MCV 89.7 09/07/2020 1136   MCHC 34.5 09/07/2020 1136   RDW 13.3 09/07/2020 1136   LYMPHSABS 1.6 09/07/2020 1136   MONOABS 0.5 09/07/2020 1136   EOSABS 0.1 09/07/2020 1136   BASOSABS 0.0 09/07/2020 1136    No results found for: "POCLITH", "LITHIUM"   No results found for: "PHENYTOIN", "PHENOBARB", "VALPROATE", "CBMZ"   .res Assessment: Plan:    Plan:  PDMP reviewed  1. Methylphenidate 54mg  CR  99/77/94  Patient moving to [facility] in August.  Patient advised to contact office with any questions, adverse effects, or acute worsening in signs and symptoms.  Discussed potential benefits, risks, and side effects of stimulants with patient to include increased heart rate, palpitations, insomnia, increased anxiety, increased irritability, or decreased appetite. Instructed patient to contact office if experiencing any significant tolerability issues.  Diagnoses and all orders for this visit:  Attention deficit hyperactivity disorder (ADHD), inattentive type, moderate -     methylphenidate 54 MG PO CR tablet; Take 1 tablet (54 mg total) by mouth in the morning. -     methylphenidate 54 MG PO CR tablet; Take 1 tablet (54 mg total) by mouth every morning. -     methylphenidate 54 MG PO CR tablet; Take 1 tablet (54 mg total) by mouth every morning.     Please see After Visit Summary for patient specific instructions.  No future appointments.  No orders of the defined types were placed in this  encounter.   -------------------------------

## 2021-11-29 IMAGING — DX DG TIBIA/FIBULA 2V*R*
2 series · 2 of 2 positions shown · non-contrast
Comparison: None.

CLINICAL DATA: Shin pain for years.

EXAM:
RIGHT TIBIA AND FIBULA - 2 VIEW

[tib-fib ap]
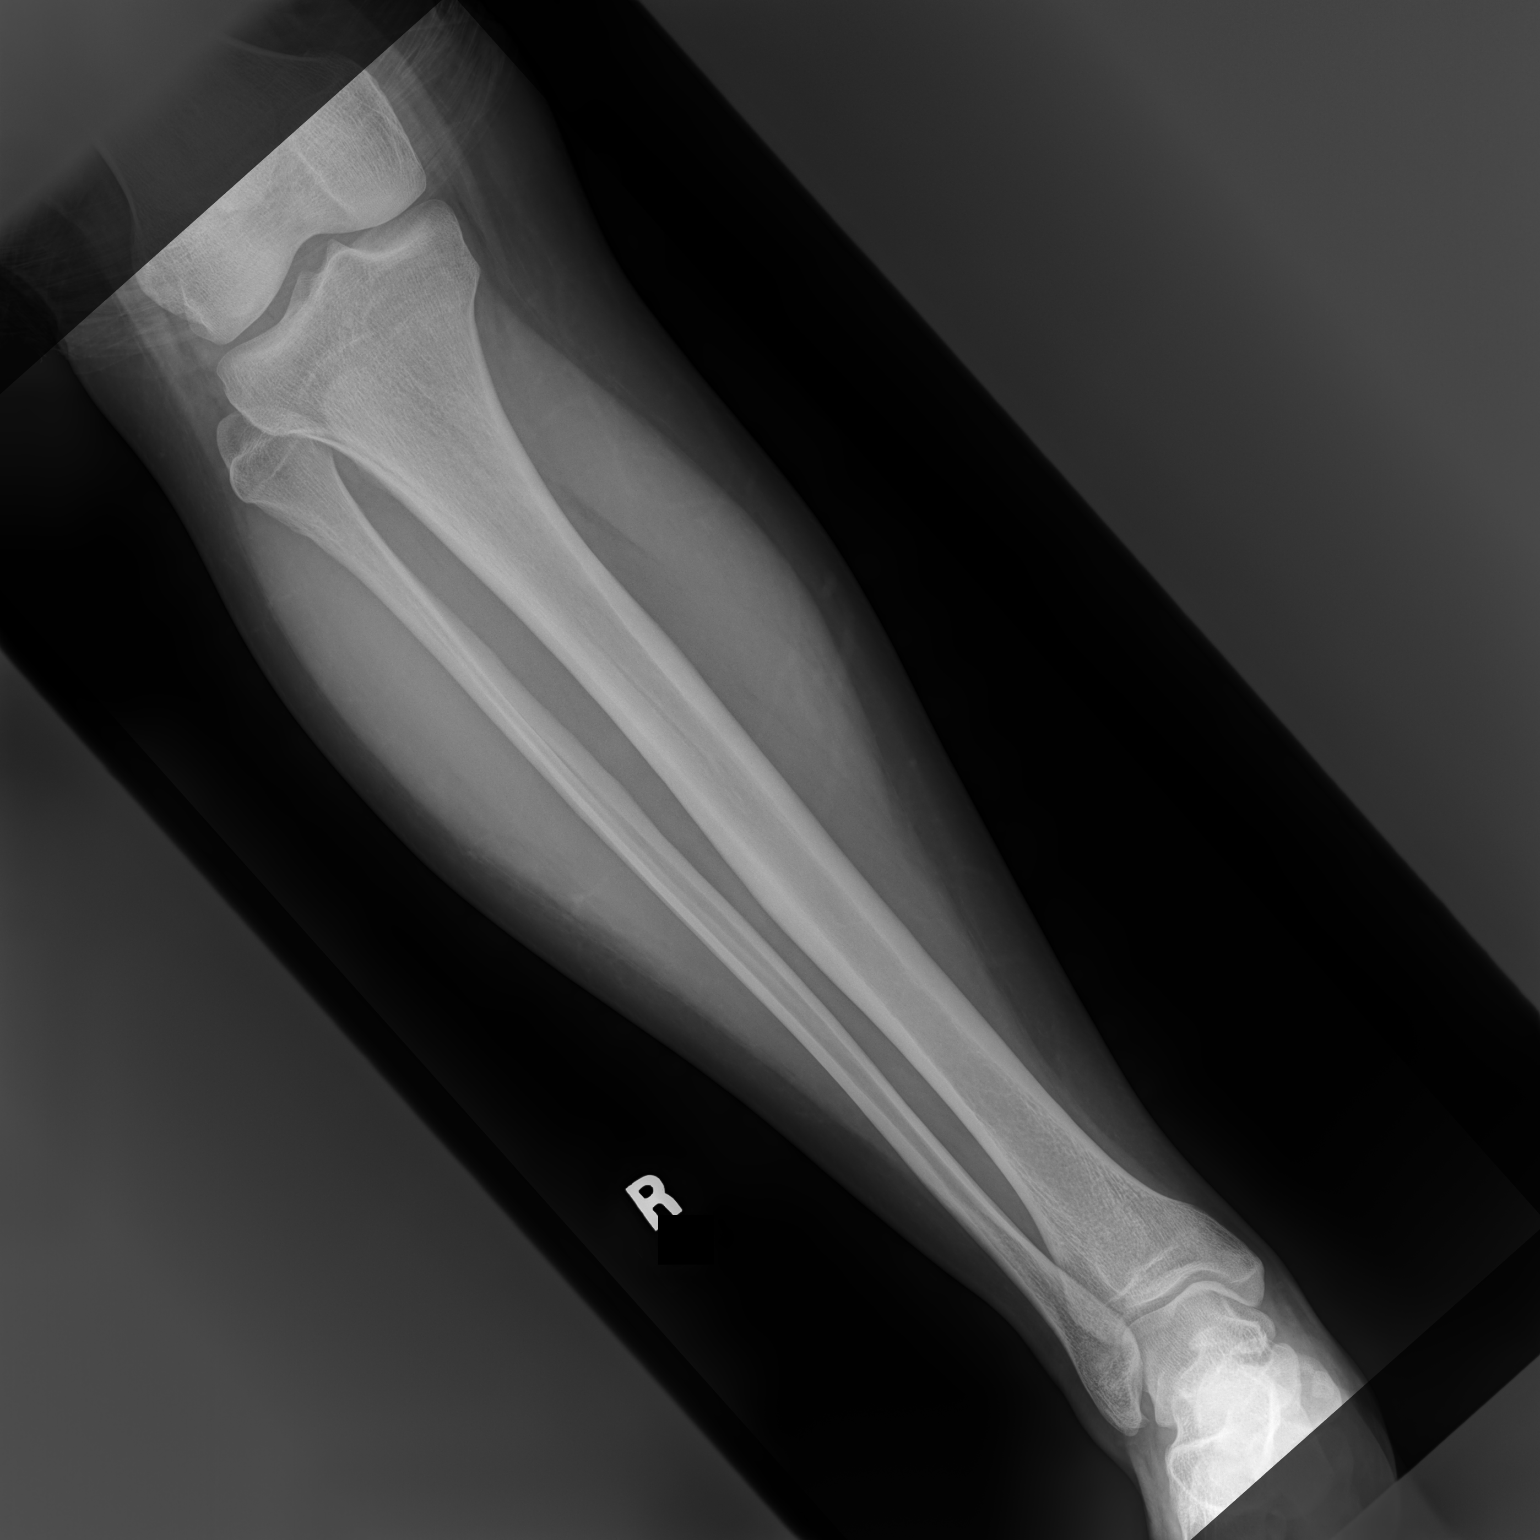

[tib-fib lat]
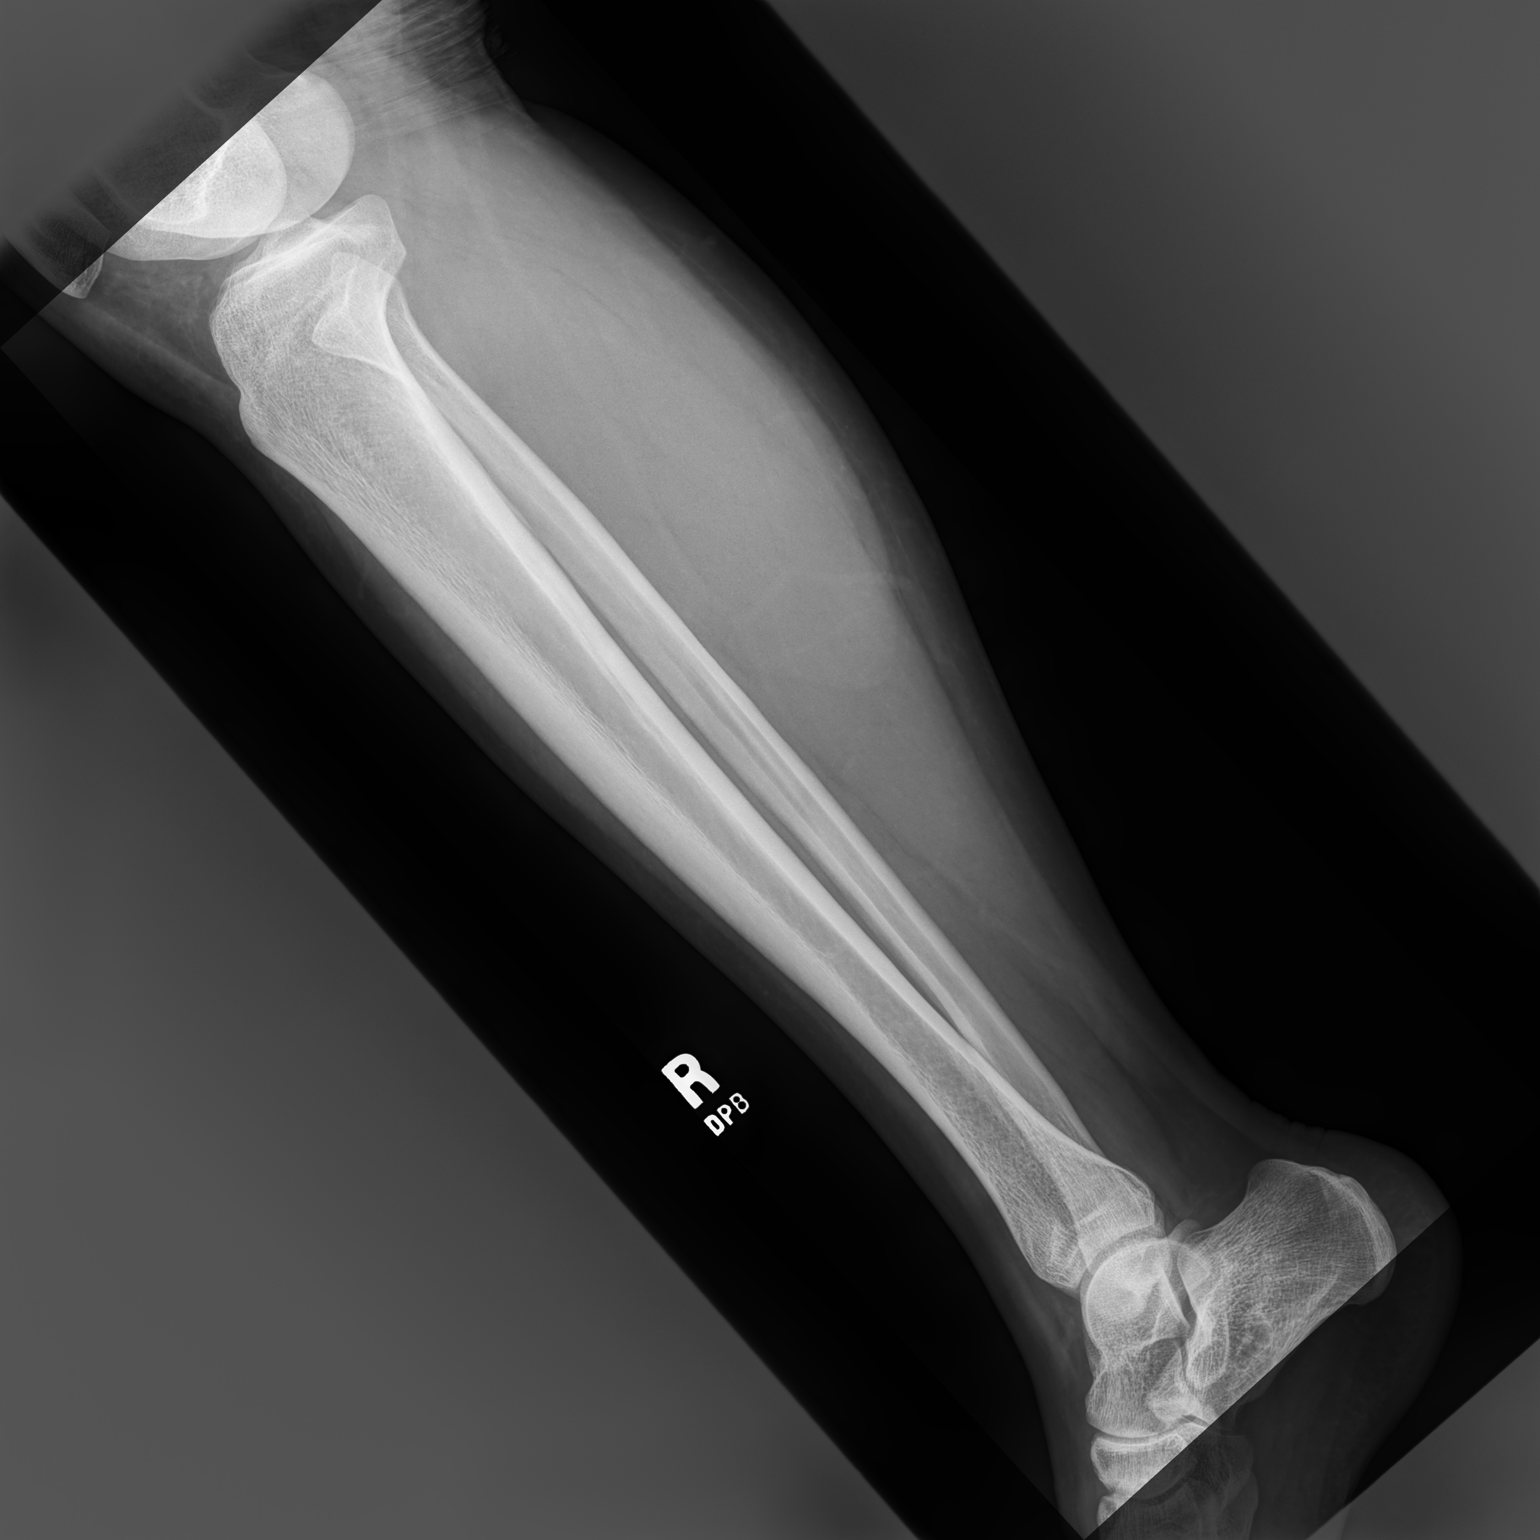

[2 of 2 positions shown; findings below may reference images not displayed]

FINDINGS: The mineralization and alignment are normal. There is no evidence of
acute fracture or dislocation. The joint spaces appear preserved at
the knee and ankle. No focal soft tissue swelling or foreign body
identified.
IMPRESSION: Normal radiographs of the right lower leg.

## 2024-03-07 ENCOUNTER — Ambulatory Visit: Admitting: Emergency Medicine

## 2024-03-07 ENCOUNTER — Encounter: Payer: Self-pay | Admitting: Emergency Medicine

## 2024-03-07 VITALS — BP 130/87 | HR 73 | Ht 66.0 in | Wt 250.0 lb

## 2024-03-07 DIAGNOSIS — F900 Attention-deficit hyperactivity disorder, predominantly inattentive type: Secondary | ICD-10-CM

## 2024-03-07 MED ORDER — METHYLPHENIDATE HCL ER 18 MG PO TB24: 36.0000 mg | ORAL_TABLET | Freq: Every day | ORAL | 0 refills | Status: DC

## 2024-03-07 NOTE — Progress Notes (Addendum)
 [facility] MD/PA/NP Initial Note  03/07/2024 2:59 PM Dylan Vasquez  MRN:  [id number]  Chief Complaint:  Chief Complaint   ADHD; Follow-up    HPI:   Mr. Haran is a 28 yo M presenting to clinic for new patient psychiatric evaluation with concerns of gradual worsening of ADHD symptoms. 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 Also, due to lack of insurance after graduation and not able to obtain refills. He has since managed symptoms with coping strategies, such as using planners and setting alarms.  However, following 2nd job loss last month, he reports gradual worsening ADHD symptoms of difficulties with concentration, frequent  forgetfulness of appointments, events, dates and making careless mistakes leading to job termination. The symptoms have also negatively impacted his marriage and social relationships, as he  reports he struggles to maintain contact and follow-up with others.  Denies any mood related symptoms such as depression, anxiety, or irritability. He describes mood as okay, with stable motivation, interest, energy, appetite, and weight.  He reports  sleep is good with an average of 6 to 8 hours per night and   Denies mania, delirium, AVH, SI, HI, or self-harm behaviors. No further complaints at this time.   Visit Diagnosis:    ICD-10-CM   1. Attention deficit hyperactivity disorder (ADHD), inattentive type, moderate  F90.0 methylphenidate  18 MG PO CR tablet      Past Psychiatric History: No prior psych hospitalization  Past Psych Medication Trials: Vyvanse  - did not help  Adderall - dry mouth  Past Medical History:  Past Medical History:  Diagnosis Date   ADHD (attention deficit hyperactivity disorder)    Allergic rhinitis due to pollen    Anxiety    GERD (gastroesophageal reflux disease)     Past Surgical History:  Procedure Laterality Date    WISDOM TOOTH EXTRACTION Bilateral 2013    Family Psychiatric History:  M - none F - ADD/ADHD; learning disabilities  Sister - none Paternal grandparents - alcohol abuse  No FH of suicide, bipolar or schizophrenia   Family History:  Family History  Problem Relation Age of Onset   Heart disease Father    High Cholesterol Father    Hypertension Father    Learning disabilities Father    ADD / ADHD Father    Diabetes Maternal Grandfather    Early death Maternal Grandfather    Heart attack Maternal Grandfather    Heart disease Maternal Grandfather    High Cholesterol Maternal Grandfather    Alcohol abuse Paternal Grandfather    Heart attack Paternal Grandfather    Heart disease Paternal Grandfather    Prostate cancer Neg Hx    Colon cancer Neg Hx     Social History: Married - May 2025. 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 Great support group. Social History   Socioeconomic History   Marital status: Married    Spouse name: Lauraine   Number of children: Not on file   Years of education: Not on file   Highest education level: Bachelor's degree (e.g., BA, AB, BS)  Occupational History   Not on file  Tobacco Use   Smoking status: Never   Smokeless tobacco: Never  Vaping Use   Vaping status: Never Used  Substance and Sexual Activity   Alcohol use: Yes    Comment: 3-6 beers a  week or liquor   Drug use: Never   Sexual activity: Yes  Other Topics Concern   Not on file  Social History Narrative   Not on file   Social Drivers of Health   Financial Resource Strain: Low Risk  (03/07/2024)   Overall Financial Resource Strain (CARDIA)    Difficulty of Paying Living Expenses: Not hard at all  Food Insecurity: No Food Insecurity (03/07/2024)   Hunger Vital Sign    Worried About Running Out of Food in the Last Year: Never true    Ran Out of Food in the Last Year: Never true  Transportation Needs: No  Transportation Needs (03/07/2024)   PRAPARE - Administrator, Civil Service (Medical): No    Lack of Transportation (Non-Medical): No  Physical Activity: Insufficiently Active (03/07/2024)   Exercise Vital Sign    Days of Exercise per Week: 3 days    Minutes of Exercise per Session: 30 min  Stress: Stress Concern Present (03/07/2024)   [employer] of Occupational Health - Occupational Stress Questionnaire    Feeling of Stress: To some extent  Social Connections: Moderately Isolated (03/07/2024)   Social Connection and Isolation Panel    Frequency of Communication with Friends and Family: Once a week    Frequency of Social Gatherings with Friends and Family: Once a week    Attends Religious Services: 1 to 4 times per year    Active Member of [employer] or Organizations: No    Attends Banker Meetings: Never    Marital Status: Married    Allergies: No Known Allergies  Metabolic Disorder Labs: No results found for: HGBA1C, MPG No results found for: PROLACTIN Lab Results  Component Value Date   CHOL 215 (H) 09/07/2020   TRIG 174.0 (H) 09/07/2020   HDL 40.70 09/07/2020   CHOLHDL 5 09/07/2020   VLDL 34.8 09/07/2020   LDLCALC 140 (H) 09/07/2020   No results found for: TSH  Therapeutic Level Labs: No results found for: LITHIUM No results found for: VALPROATE No results found for: CBMZ  Current Medications: Current Outpatient Medications  Medication Sig Dispense Refill   cetirizine (ZYRTEC) 10 MG tablet Take 10 mg by mouth as needed for allergies.     fluticasone (FLONASE) 50 MCG/ACT nasal spray Place 1 spray into the nose daily.     methylphenidate  18 MG PO CR tablet Take 2 tablets (36 mg total) by mouth daily. 60 tablet 0   ibuprofen (ADVIL) 200 MG tablet Take 200-400 mg by mouth as needed.     methylphenidate  54 MG PO CR tablet Take 1 tablet (54 mg total) by mouth every morning. (Patient not taking: Reported on 03/07/2024) 30 tablet 0    methylphenidate  54 MG PO CR tablet Take 1 tablet (54 mg total) by mouth every morning. (Patient not taking: Reported on 03/07/2024) 30 tablet 0   No current facility-administered medications for this visit.    Medication Side Effects: none  Orders placed this visit:  No orders of the defined types were placed in this encounter.   Psychiatric Specialty Exam:  Review of Systems  Psychiatric/Behavioral:         Please refer to HPI.  All other systems reviewed and are negative.   Blood pressure 130/87, pulse 73, height 5' 6 (1.676 m), weight 250 lb (113.4 kg).Body mass index is 40.35 kg/m.  General Appearance: Well Groomed  Eye Contact:  Good  Speech:  Normal Rate  Volume:  Normal  Mood:  Euthymic  Affect:  Appropriate  Thought Process:  Coherent, Goal Directed, and Linear  Orientation:  Full (Time, Place, and Person)  Thought Content: WDL   Suicidal Thoughts:  No  Homicidal Thoughts:  No  Memory:  WNL  Judgement:  Good  Insight:  Good  Psychomotor Activity:  Normal  Concentration:  Concentration: Good  Recall:  Good  Fund of Knowledge: Good  Language: Good  Assets:  Communication Skills Desire for Improvement Financial Resources/Insurance Intimacy Physical Health Social Support Talents/Skills Transportation Vocational/Educational  ADL's:  Intact  Cognition: WNL  Prognosis:  Good   Screenings:   Completed ASRS  PART A: 21/72 PART B: 42/72   Total: 63/72  GAD-7    Flowsheet Row Office Visit from 03/19/2018 in [facility] [place]  Total GAD-7 Score 14   PHQ2-9    Flowsheet Row Office Visit from 09/07/2020 in [facility] [employer] [facility] Visit from 03/19/2018 in [facility] at [facility] [place]  PHQ-2 Total Score 3 2  PHQ-9 Total Score 6 19    Receiving Psychotherapy: No   Treatment Plan/Recommendations:   I provided approximately 30 minutes of face to face time during this  encounter, including time spent before and after the visit in records review, medical decision making, counseling pertinent to today's visit, and charting.    Discussed dx and tx plan. Discussed alternative options including therapy.    ADHD - not controlled PDMP reviewed: low risk trend  Completed ASRS  PART A: 21/72 PART B: 42/72   Total: 63/72  Rx Methylphenidate  18 mg CR x 2 tablets (total 36 mg) PO in the morning.  Will consider increase to 54 mg daily at FU, if tolerated and if persistent symptoms remain. Pt was previously on 54 mg in 2023 and worked well. Discussed potential benefits, risks, and SE of stimulants with patient to include increased heart rate, palpitations, insomnia, increased anxiety, increased irritability, or decreased appetite.  Advised to keep a medication SE journal to systematically track any symptoms, noting onset, frequency, severity, and context of side effects.  FU - 4 weeks or sooner if clinically indicated.   Patient engaged in shared decision-making;treatment plan reviewed and agreed upon.     Khilynn Borntreger, PA-C

## 2024-04-04 ENCOUNTER — Encounter: Payer: Self-pay | Admitting: Emergency Medicine

## 2024-04-04 ENCOUNTER — Ambulatory Visit: Admitting: Emergency Medicine

## 2024-04-04 DIAGNOSIS — F900 Attention-deficit hyperactivity disorder, predominantly inattentive type: Secondary | ICD-10-CM | POA: Diagnosis not present

## 2024-04-04 MED ORDER — METHYLPHENIDATE HCL ER (OSM) 54 MG PO TBCR: 54.0000 mg | EXTENDED_RELEASE_TABLET | ORAL | 0 refills | Status: DC

## 2024-04-04 NOTE — Progress Notes (Signed)
 Dylan Vasquez [id number] [birth year] 28 y.o.  Subjective:   Patient ID:  Dylan Vasquez is a 28 y.o. (DOB February 11, 1996) male.  Chief Complaint: No chief complaint on file.   HPI Dylan Vasquez presents to the office today for follow-up for routine medication management.  Current Psych Medication: No SE reported. Methylphenidate  18 mg CR x 2 tablets (total 36 mg) PO in the morning.   Pt is doing well since LOV. Notes mild improvement in concentration, focus, decreased stress levels, increased energy and motivation. He reports being able to remember tasks, appointments, and decreased careless mistakes. Also, notes positive impact on marriage - he is able to remember date night, details wife shared, and completing household responsibilities. Also, he reports major reduction in anger and irritability which has helped with communication with wife. However, still experiences mental/brain fog, and procrastination especially with difficult/multi-step task. He expresses interest to increasing dosage to 54 mg which he found effective in the past.   Anxiety symptoms are improved with decreased worry and restlessness; no panic attacks reported. Sleep is adequate and restful. Appetite is stable, with normal weight and intact ADLs and personal hygiene. Ongoing symptom monitoring continues.   Denies mania, delirium, AVH, SI, HI or self-harm behaviors. No further complaints at this time.  GAD-7    Flowsheet Row Office Visit from 03/19/2018 in [facility]  Total GAD-7 Score 14   PHQ2-9    Flowsheet Row Office Visit from 09/07/2020 in [facility] Visit from 03/19/2018 in [facility]  PHQ-2 Total Score 3 2  PHQ-9 Total Score 6 19     Review of Systems:  Review of Systems  Constitutional:  Positive for fatigue.  Psychiatric/Behavioral:         Please refer to HPI.  All other systems  reviewed and are negative.  Past medications for mental health diagnoses include: Vyvanse  - did not help  Adderall - dry mouth  Medications: I have reviewed the patient's current medications.  Current Outpatient Medications  Medication Sig Dispense Refill   cetirizine (ZYRTEC) 10 MG tablet Take 10 mg by mouth as needed for allergies.     fluticasone (FLONASE) 50 MCG/ACT nasal spray Place 1 spray into the nose daily.     ibuprofen (ADVIL) 200 MG tablet Take 200-400 mg by mouth as needed.     methylphenidate  18 MG PO CR tablet Take 2 tablets (36 mg total) by mouth daily. 60 tablet 0   methylphenidate  54 MG PO CR tablet Take 1 tablet (54 mg total) by mouth every morning. (Patient not taking: Reported on 03/07/2024) 30 tablet 0   methylphenidate  54 MG PO CR tablet Take 1 tablet (54 mg total) by mouth every morning. (Patient not taking: Reported on 03/07/2024) 30 tablet 0   No current facility-administered medications for this visit.    Medication Side Effects: None  Allergies: No Known Allergies  Past Medical History:  Diagnosis Date   ADHD (attention deficit hyperactivity disorder)    Allergic rhinitis due to pollen    Anxiety    GERD (gastroesophageal reflux disease)     Past Medical History, Surgical history, Social history, and Family history were reviewed and updated as appropriate.   Please see review of systems for further details on the patient's review from today.   Objective:   Physical Exam:  There were no vitals taken for this visit.  Physical Exam Constitutional:  Appearance: Normal appearance. He is normal weight.  Neurological:     General: No focal deficit present.     Mental Status: He is alert and oriented to person, place, and time. Mental status is at baseline.  Psychiatric:        Attention and Perception: Attention and perception normal.        Mood and Affect: Mood and affect normal.        Behavior: Behavior normal. Behavior is cooperative.         Thought Content: Thought content normal.        Cognition and Memory: Cognition and memory normal.        Judgment: Judgment normal.    Lab Review:     Component Value Date/Time   NA 135 09/07/2020 1136   K 4.6 09/07/2020 1136   CL 100 09/07/2020 1136   CO2 27 09/07/2020 1136   GLUCOSE 91 09/07/2020 1136   BUN 12 09/07/2020 1136   CREATININE 0.79 09/07/2020 1136   CALCIUM 9.9 09/07/2020 1136   PROT 7.3 09/07/2020 1136   ALBUMIN 4.5 09/07/2020 1136   AST 19 09/07/2020 1136   ALT 40 09/07/2020 1136   ALKPHOS 70 09/07/2020 1136   BILITOT 0.3 09/07/2020 1136       Component Value Date/Time   WBC 5.7 09/07/2020 1136   RBC 4.75 09/07/2020 1136   HGB 14.7 09/07/2020 1136   HCT 42.6 09/07/2020 1136   PLT 308.0 09/07/2020 1136   MCV 89.7 09/07/2020 1136   MCHC 34.5 09/07/2020 1136   RDW 13.3 09/07/2020 1136   LYMPHSABS 1.6 09/07/2020 1136   MONOABS 0.5 09/07/2020 1136   EOSABS 0.1 09/07/2020 1136   BASOSABS 0.0 09/07/2020 1136    No results found for: POCLITH, LITHIUM   No results found for: PHENYTOIN, PHENOBARB, VALPROATE, CBMZ   .res Assessment: Plan:    There are no diagnoses linked to this encounter.   Please see After Visit Summary for patient specific instructions.  Future Appointments  Date Time Provider Department Center  04/04/2024  1:00 PM Florina Friends, PA-C [facility] None    No orders of the defined types were placed in this encounter.  I provided approximately 30 minutes of face to face time during this encounter, including time spent before and after the visit in records review, medical decision making, counseling pertinent to today's visit, and charting.   Discussed dx and tx plan. Discussed alternative options including therapy.   ADHD - not controlled PDMP reviewed: low risk trend Last refill: Methylphenidate  - 10/27   Increase Methylphenidate  from 36 mg to 54 mg po in the morning.  Discussed potential benefits, risks,  and SE of stimulants with patient to include increased heart rate, palpitations, insomnia, increased anxiety, increased irritability, or decreased appetite.  Advised to keep a medication SE journal to systematically track any symptoms, noting onset, frequency, severity, and context of side effects.   FOLLOW UP  - 4 weeks (virtual)  or sooner if clinically indicated.  Risks, benefits, and alternatives of the medications and treatment plan prescribed today were discussed. Patient engaged in shared decision-making;treatment plan reviewed and agreed upon.    Niajah Sipos PA-C

## 2024-05-09 ENCOUNTER — Ambulatory Visit: Admitting: Emergency Medicine

## 2024-05-09 ENCOUNTER — Encounter: Payer: Self-pay | Admitting: Emergency Medicine

## 2024-05-09 DIAGNOSIS — F988 Other specified behavioral and emotional disorders with onset usually occurring in childhood and adolescence: Secondary | ICD-10-CM | POA: Diagnosis not present

## 2024-05-09 MED ORDER — METHYLPHENIDATE HCL ER (OSM) 54 MG PO TBCR: 54.0000 mg | EXTENDED_RELEASE_TABLET | ORAL | 0 refills | Status: DC

## 2024-05-09 NOTE — Progress Notes (Signed)
 Himmat Enberg [id number] December 31, 1995 28 y.o.  Subjective:   Patient ID:  Kendre Jacinto is a 28 y.o. (DOB 07/30/1995) male.  Chief Complaint:  Chief Complaint  Patient presents with   Follow-up   ADD   HPI Adrin Julian presents to the office today for follow-up for routine medication management.  05/09/2024  Current Psych Medication: No SE reported. Methylphenidate  54 mg PO in the morning  Pt is doing well. Notes more improvement in concentration and focus since increasing dosage to 54 mg. Decision-making has improved with being more decisive and assertive. Pt reports decrease in mental/brain fog, and improvement with procrastination especially with difficult/multi-step task  Anxiety symptoms better controlled with decreased worry and restlessness; no panic attacks reported. Pt reports no longer feeling anxious without reason and utilizes coping strategies which include breathing techniques and venting to loved ones to help manage stress.   Sleep is adequate with no frequent awakenings and restful with 6-8 hours of sleep nightly. Appetite is stable with decreased snacking, with normal weight and intact ADLs and personal hygiene. Ongoing symptom monitoring continues.   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 Having difficulty due to holiday season, which is attributing to stress levels.   Denies mania, delirium, AVH, SI, HI or self-harm behaviors. No further complaints at this time. ________________________ 04/04/2024  Current Psych Medication: No SE reported. Methylphenidate  18 mg CR x 2 tablets (total 36 mg) PO in the morning.   Pt is doing well since LOV. Notes mild improvement in concentration, focus, decreased stress levels, increased energy and motivation. He reports being able to remember tasks, appointments, and decreased careless mistakes. Also, notes positive impact on marriage - he is able to remember date night, details wife shared, and  completing household responsibilities. Also, he reports major reduction in anger and irritability which has helped with communication with wife. However, still experiences mental/brain fog, and procrastination especially with difficult/multi-step task. He expresses interest to increasing dosage to 54 mg which he found effective in the past.   Anxiety symptoms are improved with decreased worry and restlessness; no panic attacks reported. Sleep is adequate and restful. Appetite is stable, with normal weight and intact ADLs and personal hygiene. Ongoing symptom monitoring continues.   Denies mania, delirium, AVH, SI, HI or self-harm behaviors. No further complaints at this time.  GAD-7    Flowsheet Row Office Visit from 03/19/2018 in [facility]  Total GAD-7 Score 14   PHQ2-9    Flowsheet Row Office Visit from 09/07/2020 in [facility] Visit from 03/19/2018 in [facility]  PHQ-2 Total Score 3 2  PHQ-9 Total Score 6 19     Review of Systems:  Review of Systems  Constitutional:  Positive for fatigue.  Psychiatric/Behavioral:         Please refer to HPI.  All other systems reviewed and are negative.  Past medications for mental health diagnoses include: Vyvanse  - did not help  Adderall - dry mouth  Medications: I have reviewed the patient's current medications.  Current Outpatient Medications  Medication Sig Dispense Refill   cetirizine (ZYRTEC) 10 MG tablet Take 10 mg by mouth as needed for allergies.     fluticasone (FLONASE) 50 MCG/ACT nasal spray Place 1 spray into the nose daily.     ibuprofen (ADVIL) 200 MG tablet Take 200-400 mg by mouth as needed.  methylphenidate  18 MG PO CR tablet Take 2 tablets (36 mg total) by mouth daily. (Patient not taking: Reported on 04/04/2024) 60 tablet 0   methylphenidate  54 MG PO CR tablet Take 1 tablet (54 mg total) by mouth  every morning. 30 tablet 0   No current facility-administered medications for this visit.    Medication Side Effects: None  Allergies: No Known Allergies  Past Medical History:  Diagnosis Date   ADHD (attention deficit hyperactivity disorder)    Allergic rhinitis due to pollen    Anxiety    GERD (gastroesophageal reflux disease)     Past Medical History, Surgical history, Social history, and Family history were reviewed and updated as appropriate.   Please see review of systems for further details on the patient's review from today.   Objective:   Physical Exam:  There were no vitals taken for this visit.  Physical Exam Constitutional:      Appearance: Normal appearance. He is normal weight.  Neurological:     General: No focal deficit present.     Mental Status: He is alert and oriented to person, place, and time. Mental status is at baseline.  Psychiatric:        Attention and Perception: Attention and perception normal.        Mood and Affect: Mood and affect normal.        Behavior: Behavior normal. Behavior is cooperative.        Thought Content: Thought content normal.        Cognition and Memory: Cognition and memory normal.        Judgment: Judgment normal.    Lab Review:     Component Value Date/Time   NA 135 09/07/2020 1136   K 4.6 09/07/2020 1136   CL 100 09/07/2020 1136   CO2 27 09/07/2020 1136   GLUCOSE 91 09/07/2020 1136   BUN 12 09/07/2020 1136   CREATININE 0.79 09/07/2020 1136   CALCIUM 9.9 09/07/2020 1136   PROT 7.3 09/07/2020 1136   ALBUMIN 4.5 09/07/2020 1136   AST 19 09/07/2020 1136   ALT 40 09/07/2020 1136   ALKPHOS 70 09/07/2020 1136   BILITOT 0.3 09/07/2020 1136       Component Value Date/Time   WBC 5.7 09/07/2020 1136   RBC 4.75 09/07/2020 1136   HGB 14.7 09/07/2020 1136   HCT 42.6 09/07/2020 1136   PLT 308.0 09/07/2020 1136   MCV 89.7 09/07/2020 1136   MCHC 34.5 09/07/2020 1136   RDW 13.3 09/07/2020 1136   LYMPHSABS 1.6  09/07/2020 1136   MONOABS 0.5 09/07/2020 1136   EOSABS 0.1 09/07/2020 1136   BASOSABS 0.0 09/07/2020 1136    No results found for: POCLITH, LITHIUM   No results found for: PHENYTOIN, PHENOBARB, VALPROATE, CBMZ   .res Assessment: Plan:    Demarrion was seen today for follow-up and add.  Diagnoses and all orders for this visit:  Attention deficit disorder, unspecified type -     methylphenidate  54 MG PO CR tablet; Take 1 tablet (54 mg total) by mouth every morning.    Please see After Visit Summary for patient specific instructions.  No future appointments.   No orders of the defined types were placed in this encounter.  I provided approximately 30 minutes of face to face time during this encounter, including time spent before and after the visit in records review, medical decision making, counseling pertinent to today's visit, and charting.   Discussed dx and tx plan. Discussed alternative  options including therapy.   ADD - not controlled PDMP reviewed: low risk trend Last refill: Methylphenidate  - 11/24   Continue Methylphenidate  54 mg po in the morning.  Discussed potential benefits, risks, and SE of stimulants with patient to include increased heart rate, palpitations, insomnia, increased anxiety, increased irritability, or decreased appetite.  Advised to keep a medication SE journal to systematically track any symptoms, noting onset, frequency, severity, and context of Discussed organizational strategies planners, time management, and mindfulness techniques to support ADHD symptoms  FOLLOW UP : 4 weeks or sooner if clinically indicated.  Risks, benefits, and alternatives of the medications and treatment plan prescribed today were discussed. Patient engaged in shared decision-making;treatment plan reviewed and agreed upon.    Fredick Schlosser PA-C

## 2024-06-06 ENCOUNTER — Telehealth: Admitting: Emergency Medicine

## 2024-06-06 ENCOUNTER — Encounter: Payer: Self-pay | Admitting: Emergency Medicine

## 2024-06-06 DIAGNOSIS — F988 Other specified behavioral and emotional disorders with onset usually occurring in childhood and adolescence: Secondary | ICD-10-CM | POA: Diagnosis not present

## 2024-06-06 MED ORDER — METHYLPHENIDATE HCL ER (OSM) 54 MG PO TBCR: 54.0000 mg | EXTENDED_RELEASE_TABLET | ORAL | 0 refills | Status: AC

## 2024-06-06 NOTE — Progress Notes (Addendum)
 Lemoyne Nestor [id number] [birth year] 29 y.o.  Virtual Visit via Telephone Note  I connected with pt on 06/06/24 at  1:00 PM EST by video call  and verified that I am speaking with the correct person using two identifiers.   I discussed the limitations, risks, security and privacy concerns of performing an evaluation and management service by telephone and the availability of in person appointments. I also discussed with the patient that there may be a patient responsible charge related to this service. The patient expressed understanding and agreed to proceed.   I discussed the assessment and treatment plan with the patient. The patient was provided an opportunity to ask questions and all were answered. The patient agreed with the plan and demonstrated an understanding of the instructions.   The patient was advised to call back or seek an in-person evaluation if the symptoms worsen or if the condition fails to improve as anticipated.  I provided 30  minutes of non-face-to-face time during this encounter.  The patient was located at home.  The provider was located at [facility].  Isidoro Chatters, PA-C  Subjective:   Patient ID:  Dylan Vasquez is a 29 y.o. (DOB 03-04-1996) male.  Chief Complaint:  Chief Complaint  Patient presents with   Follow-up   ADD   HPI Dylan Vasquez presents for follow-up for routine medication management.  _______________________________________ 06/06/2024  Current Psych Medication: No SE reported. Methylphenidate  54 mg PO in the morning  Pt is doing well and stable on medication. Notes continued improvement in concentration. Decision-making has improved with being more decisive and assertive. Pt reports less mental/brain fog, and improvement with procrastination especially with difficult/multi-step task. Pt notes improvement in completing household task including laundry, dishes and cleaning.   Anxiety symptoms better controlled with  decreased worry and restlessness; no panic attacks reported. Pt reports no longer feeling anxious without reason and utilizes coping strategies which include breathing techniques and venting to wife.  Sleep is ok with occasional awakenings and with 6-8 hours of sleep nightly. Pt reports poor sleep schedule due to unemployment and lack of structure/routine. Appetite is stable with decreased snacking. Noted weight loss of 17lbs since September. Intact ADLs and personal hygiene. Ongoing symptom monitoring continues.   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 Having difficulty due to holiday season, which is attributing to stress levels. Pt has noted decreased libido. Pt is currently actively interviewing. Shared that recent interview last week went well and plans to follow up in the next few days.  Denies mania, delirium, AVH, SI, HI or self-harm behaviors. No further complaints at this time. _________________________ 05/09/2024  Current Psych Medication: No SE reported. Methylphenidate  54 mg PO in the morning  Pt is doing well. Notes more improvement in concentration and focus since increasing dosage to 54 mg. Decision-making has improved with being more decisive and assertive. Pt reports decrease in mental/brain fog, and improvement with procrastination especially with difficult/multi-step task  Anxiety symptoms better controlled with decreased worry and restlessness; no panic attacks reported. Pt reports no longer feeling anxious without reason and utilizes coping strategies which include breathing techniques and venting to loved ones to help manage stress.   Sleep is adequate with no frequent awakenings with 6-8 hours of sleep nightly. Appetite is stable with decreased snacking, with normal weight and intact ADLs and personal hygiene. Ongoing symptom monitoring continues.   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 Having difficulty due  to holiday season, which is attributing to stress levels.   Denies mania, delirium, AVH, SI, HI or self-harm behaviors. No further complaints at this time. ________________________ 04/04/2024  Current Psych Medication: No SE reported. Methylphenidate  18 mg CR x 2 tablets (total 36 mg) PO in the morning.   Pt is doing well since LOV. Notes mild improvement in concentration, focus, decreased stress levels, increased energy and motivation. Dylan Vasquez reports being able to remember tasks, appointments, and decreased careless mistakes. Also, notes positive impact on marriage - Dylan Vasquez is able to remember date night, details wife shared, and completing household responsibilities. Also, Dylan Vasquez reports major reduction in anger and irritability which has helped with communication with wife. However, still experiences mental/brain fog, and procrastination especially with difficult/multi-step task. Dylan Vasquez expresses interest to increasing dosage to 54 mg which Dylan Vasquez found effective in the past.   Anxiety symptoms are improved with decreased worry and restlessness; no panic attacks reported. Sleep is adequate and restful. Appetite is stable, with normal weight and intact ADLs and personal hygiene. Ongoing symptom monitoring continues.   Denies mania, delirium, AVH, SI, HI or self-harm behaviors. No further complaints at this time.  GAD-7    Flowsheet Row Office Visit from 03/19/2018 in [facility] [place]  Total GAD-7 Score 14   PHQ2-9    Flowsheet Row Office Visit from 09/07/2020 in [facility] Visit from 03/19/2018 in [facility] [place]  PHQ-2 Total Score 3 2  PHQ-9 Total Score 6 19     Review of Systems:  Review of Systems  Psychiatric/Behavioral:         Please refer to HPI.  All other systems reviewed and are negative.  Past medications for mental health diagnoses include: Vyvanse  - did not help  Adderall - dry  mouth  Medications: I have reviewed the patient's current medications.  Current Outpatient Medications  Medication Sig Dispense Refill   cetirizine (ZYRTEC) 10 MG tablet Take 10 mg by mouth as needed for allergies.     fluticasone (FLONASE) 50 MCG/ACT nasal spray Place 1 spray into the nose daily.     ibuprofen (ADVIL) 200 MG tablet Take 200-400 mg by mouth as needed.     methylphenidate  54 MG PO CR tablet Take 1 tablet (54 mg total) by mouth every morning. 30 tablet 0   No current facility-administered medications for this visit.    Medication Side Effects: None  Allergies: No Known Allergies  Past Medical History:  Diagnosis Date   ADHD (attention deficit hyperactivity disorder)    Allergic rhinitis due to pollen    Anxiety    GERD (gastroesophageal reflux disease)     Past Medical History, Surgical history, Social history, and Family history were reviewed and updated as appropriate.   Please see review of systems for further details on the patient's review from today.   Objective:   Physical Exam:  There were no vitals taken for this visit.  Physical Exam Constitutional:      Appearance: Normal appearance. Dylan Vasquez is normal weight.  Neurological:     General: No focal deficit present.     Mental Status: Dylan Vasquez is alert and oriented to person, place, and time. Mental status is at baseline.  Psychiatric:        Attention and Perception: Attention and perception normal.        Mood and Affect: Mood and affect normal.  Behavior: Behavior normal. Behavior is cooperative.        Thought Content: Thought content normal.        Cognition and Memory: Cognition and memory normal.        Judgment: Judgment normal.    Lab Review:     Component Value Date/Time   NA 135 09/07/2020 1136   K 4.6 09/07/2020 1136   CL 100 09/07/2020 1136   CO2 27 09/07/2020 1136   GLUCOSE 91 09/07/2020 1136   BUN 12 09/07/2020 1136   CREATININE 0.79 09/07/2020 1136   CALCIUM 9.9 09/07/2020  1136   PROT 7.3 09/07/2020 1136   ALBUMIN 4.5 09/07/2020 1136   AST 19 09/07/2020 1136   ALT 40 09/07/2020 1136   ALKPHOS 70 09/07/2020 1136   BILITOT 0.3 09/07/2020 1136       Component Value Date/Time   WBC 5.7 09/07/2020 1136   RBC 4.75 09/07/2020 1136   HGB 14.7 09/07/2020 1136   HCT 42.6 09/07/2020 1136   PLT 308.0 09/07/2020 1136   MCV 89.7 09/07/2020 1136   MCHC 34.5 09/07/2020 1136   RDW 13.3 09/07/2020 1136   LYMPHSABS 1.6 09/07/2020 1136   MONOABS 0.5 09/07/2020 1136   EOSABS 0.1 09/07/2020 1136   BASOSABS 0.0 09/07/2020 1136    No results found for: POCLITH, LITHIUM   No results found for: PHENYTOIN, PHENOBARB, VALPROATE, CBMZ   .res Assessment: Plan:    Dylan Vasquez was seen today for follow-up and add.  Diagnoses and all orders for this visit:  Attention deficit disorder, unspecified type -     methylphenidate  54 MG PO CR tablet; Take 1 tablet (54 mg total) by mouth every morning.    Please see After Visit Summary for patient specific instructions.  No future appointments.   No orders of the defined types were placed in this encounter.  Discussed dx and tx plan. Discussed alternative options including therapy.   ADD - not controlled PDMP reviewed: low risk trend Last refill: Methylphenidate  - 12/29   Continue Methylphenidate  54 mg po in the morning.  Discussed potential benefits, risks, and SE of stimulants with patient to include increased heart rate, palpitations, insomnia, increased anxiety, increased irritability, or decreased appetite.  Advised to keep a medication SE journal to systematically track any symptoms, noting onset, frequency, severity, and context of Discussed organizational strategies planners, time management, and mindfulness techniques to support ADHD symptoms  FOLLOW UP : 2/23 @ 2pm or sooner if clinically indicated.  Risks, benefits, and alternatives of the medications and treatment plan prescribed today were  discussed. Patient engaged in shared decision-making;treatment plan reviewed and agreed upon.    Bradshaw Minihan PA-C, DMSc

## 2024-07-04 ENCOUNTER — Ambulatory Visit: Admitting: Emergency Medicine
